# Patient Record
Sex: Female | Born: 1955 | Race: White | Hispanic: No | Marital: Married | Smoking: Former smoker
Health system: Southern US, Community
[De-identification: ages and names within clinical notes are randomized; demographics above are authoritative.]

## PROBLEM LIST (undated history)

## (undated) DIAGNOSIS — F419 Anxiety disorder, unspecified: Principal | ICD-10-CM

## (undated) DIAGNOSIS — I499 Cardiac arrhythmia, unspecified: Secondary | ICD-10-CM

## (undated) DIAGNOSIS — N952 Postmenopausal atrophic vaginitis: Secondary | ICD-10-CM

## (undated) DIAGNOSIS — K743 Primary biliary cirrhosis: Secondary | ICD-10-CM

## (undated) DIAGNOSIS — E785 Hyperlipidemia, unspecified: Secondary | ICD-10-CM

## (undated) DIAGNOSIS — M7550 Bursitis of unspecified shoulder: Secondary | ICD-10-CM

## (undated) DIAGNOSIS — Z01419 Encounter for gynecological examination (general) (routine) without abnormal findings: Secondary | ICD-10-CM

## (undated) DIAGNOSIS — K649 Unspecified hemorrhoids: Secondary | ICD-10-CM

## (undated) DIAGNOSIS — I8390 Asymptomatic varicose veins of unspecified lower extremity: Secondary | ICD-10-CM

## (undated) DIAGNOSIS — N951 Menopausal and female climacteric states: Secondary | ICD-10-CM

## (undated) DIAGNOSIS — N841 Polyp of cervix uteri: Secondary | ICD-10-CM

## (undated) DIAGNOSIS — R932 Abnormal findings on diagnostic imaging of liver and biliary tract: Secondary | ICD-10-CM

## (undated) DIAGNOSIS — Z9289 Personal history of other medical treatment: Secondary | ICD-10-CM

## (undated) HISTORY — DX: Postmenopausal atrophic vaginitis: N95.2

## (undated) HISTORY — DX: Encounter for gynecological examination (general) (routine) without abnormal findings: Z01.419

## (undated) HISTORY — DX: Unspecified hemorrhoids: K64.9

## (undated) HISTORY — DX: Hyperlipidemia, unspecified: E78.5

## (undated) HISTORY — PX: COLONOSCOPY: SHX174

## (undated) HISTORY — DX: Menopausal and female climacteric states: N95.1

## (undated) HISTORY — DX: Primary biliary cirrhosis: K74.3

## (undated) HISTORY — DX: Bursitis of unspecified shoulder: M75.50

## (undated) HISTORY — DX: Abnormal findings on diagnostic imaging of liver and biliary tract: R93.2

## (undated) HISTORY — DX: Polyp of cervix uteri: N84.1

## (undated) HISTORY — DX: Cardiac arrhythmia, unspecified: I49.9

## (undated) HISTORY — DX: Anxiety disorder, unspecified: F41.9

## (undated) HISTORY — DX: Asymptomatic varicose veins of unspecified lower extremity: I83.90

## (undated) HISTORY — DX: Personal history of other medical treatment: Z92.89

---

## 2012-07-07 DIAGNOSIS — K743 Primary biliary cirrhosis: Secondary | ICD-10-CM

## 2012-07-07 HISTORY — DX: Primary biliary cirrhosis: K74.3

## 2013-01-11 ENCOUNTER — Other Ambulatory Visit: Payer: Self-pay | Admitting: Medical

## 2013-01-11 ENCOUNTER — Ambulatory Visit (INDEPENDENT_AMBULATORY_CARE_PROVIDER_SITE_OTHER): Payer: BC Managed Care – PPO | Admitting: Medical

## 2013-01-11 ENCOUNTER — Encounter: Payer: Self-pay | Admitting: Medical

## 2013-01-11 ENCOUNTER — Other Ambulatory Visit (HOSPITAL_COMMUNITY)
Admission: RE | Admit: 2013-01-11 | Discharge: 2013-01-11 | Disposition: A | Discharge status: Home / Self Care | Payer: BC Managed Care – PPO | Source: Ambulatory Visit | Attending: Family Medicine | Admitting: Family Medicine

## 2013-01-11 VITALS — BP 98/70 | HR 62 | Temp 98.0°F | Resp 16 | Ht 68.5 in | Wt 187.0 lb

## 2013-01-11 DIAGNOSIS — N951 Menopausal and female climacteric states: Secondary | ICD-10-CM

## 2013-01-11 DIAGNOSIS — R454 Irritability and anger: Secondary | ICD-10-CM

## 2013-01-11 DIAGNOSIS — R234 Changes in skin texture: Secondary | ICD-10-CM

## 2013-01-11 DIAGNOSIS — Z1239 Encounter for other screening for malignant neoplasm of breast: Secondary | ICD-10-CM

## 2013-01-11 DIAGNOSIS — L988 Other specified disorders of the skin and subcutaneous tissue: Secondary | ICD-10-CM

## 2013-01-11 DIAGNOSIS — Z0000 Encounter for general adult medical examination without abnormal findings: Secondary | ICD-10-CM

## 2013-01-11 DIAGNOSIS — Z01419 Encounter for gynecological examination (general) (routine) without abnormal findings: Secondary | ICD-10-CM | POA: Insufficient documentation

## 2013-01-11 DIAGNOSIS — Z1211 Encounter for screening for malignant neoplasm of colon: Secondary | ICD-10-CM

## 2013-01-11 DIAGNOSIS — Z124 Encounter for screening for malignant neoplasm of cervix: Secondary | ICD-10-CM

## 2013-01-11 LAB — POCT URINALYSIS DIPSTICK
Bilirubin, UA: NEGATIVE
Blood, UA: NEGATIVE
Glucose, UA: NEGATIVE
Ketones, UA: NEGATIVE
Leukocytes, UA: NEGATIVE
Nitrite, UA: NEGATIVE
Protein, UA: NEGATIVE
Spec Grav, UA: <=1.005
Urobilinogen, UA: NEGATIVE
pH, UA: 7

## 2013-01-11 LAB — COMPREHENSIVE METABOLIC PANEL
ALT: 51 U/L — ABNORMAL HIGH (ref 0–35)
AST: 61 U/L — ABNORMAL HIGH (ref 0–37)
Albumin: 3.9 g/dL (ref 3.5–5.2)
Alkaline Phosphatase: 239 U/L — ABNORMAL HIGH (ref 39–117)
BUN: 13 mg/dL (ref 6–23)
CO2: 27 mEq/L (ref 19–32)
Calcium: 9.4 mg/dL (ref 8.4–10.5)
Chloride: 103 mEq/L (ref 96–112)
Creat: 0.72 mg/dL (ref 0.50–1.10)
Glucose, Bld: 90 mg/dL (ref 70–99)
Potassium: 4.4 mEq/L (ref 3.5–5.3)
Sodium: 137 mEq/L (ref 135–145)
Total Bilirubin: 0.6 mg/dL (ref 0.3–1.2)
Total Protein: 8.8 g/dL — ABNORMAL HIGH (ref 6.0–8.3)

## 2013-01-11 LAB — LIPID PANEL
Cholesterol: 186 mg/dL (ref 0–200)
HDL: 51 mg/dL (ref >39)
LDL Cholesterol: 114 mg/dL — ABNORMAL HIGH (ref 0–99)
Total CHOL/HDL Ratio: 3.6 Ratio
Triglycerides: 106 mg/dL (ref <150)
VLDL: 21 mg/dL (ref 0–40)

## 2013-01-11 NOTE — Progress Notes (Addendum)
Subjective:   HPI  Michelle Mcmahon is a 57 y.o. female who presents for a complete physical.  New patient today.   Moved from [place] to [place] in 2012.     Preventative care: Last ophthalmology visit:n/a Last dental visit:yes Last colonoscopy:never  Last mammogram:never Last gynecological exam:some years ago Last ZOX:WRUEA Last labs:n/a  Prior vaccinations: TD or Tdap:with in 7 years Influenza:n/a Pneumococcal:n/a Shingles/Zostavax:n/a  Advanced directive:n/a Health care power of attorney:n/a Living will:n/a  Concerns: Menopausal symptoms - hot flashes, emotional irritability at times, thinning skin, some weight gain x "forever", 6-7 years ago.   No prior medication for this.   Drinks a lot of water, has fan on her at night.  Caffeine, sugar can make this worse.  Reviewed their medical, surgical, family, social, medication, and allergy history and updated chart as appropriate.   Past Medical History  Diagnosis Date  . Menopausal state   . Shoulder bursitis     left, prior steroidal shots at [facility]    Past Surgical History  Procedure Laterality Date  . Colonoscopy      never    Family History  Problem Relation Age of Onset  . Other Mother     accidental death  . Cancer Father     died of colon cancer age 7yo  . Heart disease Father     palpitations  . Cancer Brother     bone  . Diabetes Maternal Grandmother   . Heart disease Maternal Grandmother   . Stroke Neg Hx   . Hypertension Neg Hx     History   Social History  . Marital Status: Married    Spouse Name: N/A    Number of Children: N/A  . Years of Education: N/A   Occupational History  . Not on file.   Social History Main Topics  . Smoking status: Former Smoker    Types: Cigarettes  . Smokeless tobacco: Not on file  . Alcohol Use: 1.8 oz/week    1 Glasses of wine, 1 Cans of beer, 1 Shots of liquor per week  . Drug Use: No  . Sexually Active: Not on file   Other Topics Concern  . 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   No current facility-administered medications on file prior to visit.    No Known Allergies   Review of Systems Constitutional: -fever, -chills, -sweats, -unexpected weight change, -decreased appetite, -fatigue Allergy: -sneezing, -itching, -congestion Dermatology: -changing moles, --rash, -lumps ENT: -runny nose, -ear pain, -sore throat, -hoarseness, -sinus pain, -teeth pain, - ringing in ears, -hearing loss, -nosebleeds Cardiology: -chest pain, -palpitations, -swelling, -difficulty breathing when lying flat, -waking up short of breath Respiratory: -cough, -shortness of breath, -difficulty breathing with exercise or exertion, -wheezing, -coughing up blood Gastroenterology: -abdominal pain, -nausea, -vomiting, -diarrhea, -constipation, -blood in stool, -changes in bowel movement, -difficulty swallowing or eating Hematology: -bleeding, -bruising  Musculoskeletal: +joint aches, -muscle aches, -joint swelling, -back pain, -neck pain, -cramping, -changes in gait Ophthalmology: denies vision changes, eye redness, itching, discharge Urology: -burning with urination, -difficulty urinating, -blood in urine, -urinary frequency, -urgency, -incontinence Neurology: -headache, -weakness, -tingling, -numbness, -memory loss, -falls, -dizziness Psychology: +depressed mood, +agitation, +sleep problems     Objective:   Physical Exam  Nurse notes and vitals reviewed  General appearance: alert, no distress, WD/WN, pleasant  white female Skin: bilat forearms with some mild small 2cm area of ecchymosis, with adjacent abrasions, superficial, skin seems somewhat thin, other scatter benign macules and papular lesions chest, back, no worrisome lesions HEENT: normocephalic, conjunctiva/corneas normal, sclerae  anicteric, PERRLA, EOMi, nares patent, no discharge or erythema, pharynx normal Oral cavity: MMM, tongue normal, teeth in good repair Neck: supple, no lymphadenopathy, no thyromegaly, no masses, normal ROM, no bruits Chest: non tender, normal shape and expansion Heart: RRR, normal S1, S2, no murmurs Lungs: CTA bilaterally, no wheezes, rhonchi, or rales Abdomen: +bs, soft, non tender, non distended, no masses, no hepatomegaly, no splenomegaly, no bruits Back: non tender, normal ROM, no scoliosis Musculoskeletal: upper extremities non tender, no obvious deformity, normal ROM throughout, lower extremities non tender, no obvious deformity, normal ROM throughout Extremities: no edema, no cyanosis, no clubbing Pulses: 2+ symmetric, upper and lower extremities, normal cap refill Neurological: alert, oriented x 3, CN2-12 intact, strength normal upper extremities and lower extremities, sensation normal throughout, DTRs 2+ throughout, no cerebellar signs, gait normal Psychiatric: normal affect, behavior normal, pleasant  Breast: nontender, no masses or lumps, no skin changes, no nipple discharge or inversion, no axillary lymphadenopathy Gyn: Normal external genitalia without lesions, vagina with normal mucosa, cervix normal appearing , no cervical motion tenderness, no abnormal vaginal discharge.  Uterus and adnexa not enlarged, nontender, no masses.  Pap performed.  Exam chaperoned by nurse. Rectal: deferred    Assessment and Plan :    Encounter Diagnoses  Name Primary?  . Routine general medical examination at a health care facility Yes  . Screening for breast cancer   . Special screening for malignant neoplasms, colon   . Screening for cervical cancer   . Menopausal state   . Irritability   . Thin skin       Physical exam - discussed healthy lifestyle, diet, exercise, preventative care, vaccinations, and addressed their concerns.  Handout given.  Referrals for her first colonoscopy and  mammogram, pap updated today, routine labs today.  additional labs given her symptoms.    menopause - handout given, discussed supportive measures, possible medications pending labs.  Discussed risks/benefits of HRT and she would rather avoid this for now.    Follow-up pending labs, referrals

## 2013-01-11 NOTE — Progress Notes (Signed)
PATIENT IS AWARE OF HER MAMMOGRAM APPOINTMENT ON January 27, 2013 @ 245 PM [facility]. CLS

## 2013-01-11 NOTE — Patient Instructions (Signed)
Menopause Menopause is the normal time of life when menstrual periods stop completely. Menopause is complete when you have missed 12 consecutive menstrual periods. It usually occurs between the ages of 48 to 55, with an average age of 51. Very rarely does a woman develop menopause before 56 years old. At menopause, your ovaries stop producing the female hormones, estrogen and progesterone. This can cause undesirable symptoms and also affect your health. Sometimes the symptoms may occur 4 to 5 years before the menopause begins. There is no relationship between menopause and:  Oral contraceptives.  Number of children you had.  Race.  The age your menstrual periods started (menarche). Heavy smokers and very thin women may develop menopause earlier in life. CAUSES  The ovaries stop producing the female hormones estrogen and progesterone.  Other causes include:  Surgery to remove both ovaries.  The ovaries stop functioning for no known reason.  Tumors of the pituitary gland in the brain.  Medical disease that affects the ovaries and hormone production.  Radiation treatment to the abdomen or pelvis.  Chemotherapy that affects the ovaries. SYMPTOMS   Hot flashes.  Night sweats.  Decrease in sex drive.  Vaginal dryness and thinning of the vagina causing painful intercourse.  Dryness of the skin and developing wrinkles.  Headaches.  Tiredness.  Irritability.  Memory problems.  Weight gain.  Bladder infections.  Hair growth of the face and chest.  Infertility. More serious symptoms include:  Loss of bone (osteoporosis) causing breaks (fractures).  Depression.  Hardening and narrowing of the arteries (atherosclerosis) causing heart attacks and strokes. DIAGNOSIS   When the menstrual periods have stopped for 12 straight months.  Physical exam.  Hormone studies of the blood. TREATMENT  There are many treatment choices and nearly as many questions about them.  The decisions to treat or not to treat menopausal changes is an individual choice made with your caregiver. Your caregiver can discuss the treatments with you. Together, you can decide which treatment will work best for you. Your treatment choices may include:   Hormone therapy (estorgen and progesterone).  Non-hormonal medications.  Treating the individual symptoms with medication (for example antidepressants for depression).  Herbal medications that may help specific symptoms.  Counseling by a psychiatrist or psychologist.  Group therapy.  Lifestyle changes including:  Eating healthy.  Regular exercise.  Limiting caffeine and alcohol.  Stress management and meditation.  No treatment. HOME CARE INSTRUCTIONS   Take the medication your caregiver gives you as directed.  Get plenty of sleep and rest.  Exercise regularly.  Eat a diet that contains calcium (good for the bones) and soy products (acts like estrogen hormone).  Avoid alcoholic beverages.  Do not smoke.  If you have hot flashes, dress in layers.  Take supplements, calcium and vitamin D to strengthen bones.  You can use over-the-counter lubricants or moisturizers for vaginal dryness.  Group therapy is sometimes very helpful.  Acupuncture may be helpful in some cases. SEEK MEDICAL CARE IF:   You are not sure you are in menopause.  You are having menopausal symptoms and need advice and treatment.  You are still having menstrual periods after age 55.  You have pain with intercourse.  Menopause is complete (no menstrual period for 12 months) and you develop vaginal bleeding.  You need a referral to a specialist (gynecologist, psychiatrist or psychologist) for treatment. SEEK IMMEDIATE MEDICAL CARE IF:   You have severe depression.  You have excessive vaginal bleeding.  You fell and   think you have a broken bone.  You have pain when you urinate.  You develop leg or chest pain.  You have a fast  pounding heart beat (palpitations).  You have severe headaches.  You develop vision problems.  You feel a lump in your breast.  You have abdominal pain or severe indigestion. Document Released: 09/13/2003 Document Revised: 09/15/2011 Document Reviewed: 04/20/2008 [facility] Patient Information 2014 [place], [place].    Preventative Care for Adults - Female      MAINTAIN REGULAR HEALTH EXAMS:  A routine yearly physical is a good way to check in with your primary care provider about your health and preventive screening. It is also an opportunity to share updates about your health and any concerns you have, and receive a thorough all-over exam.   Most health insurance companies pay for at least some preventative services.  Check with your health plan for specific coverages.  WHAT PREVENTATIVE SERVICES DO WOMEN NEED?  Adult women should have their weight and blood pressure checked regularly.   Women age 49 and older should have their cholesterol levels checked regularly.  Women should be screened for cervical cancer with a Pap smear and pelvic exam beginning at either age 48, or 3 years after they become sexually activity.    Breast cancer screening generally begins at age 70 with a mammogram and breast exam by your primary care provider.    Beginning at age 20 and continuing to age 26, women should be screened for colorectal cancer.  Certain people may need continued testing until age 63.  Updating vaccinations is part of preventative care.  Vaccinations help protect against diseases such as the flu.  Osteoporosis is a disease in which the bones lose minerals and strength as we age. Women ages 69 and over should discuss this with their caregivers, as should women after menopause who have other risk factors.  Lab tests are generally done as part of preventative care to screen for anemia and blood disorders, to screen for problems with the kidneys and liver, to screen for bladder  problems, to check blood sugar, and to check your cholesterol level.  Preventative services generally include counseling about diet, exercise, avoiding tobacco, drugs, excessive alcohol consumption, and sexually transmitted infections.    GENERAL RECOMMENDATIONS FOR GOOD HEALTH:  Healthy diet:  Eat a variety of foods, including fruit, vegetables, animal or vegetable protein, such as meat, fish, chicken, and eggs, or beans, lentils, tofu, and grains, such as rice.  Drink plenty of water daily.  Decrease saturated fat in the diet, avoid lots of red meat, processed foods, sweets, fast foods, and fried foods.  Exercise:  Aerobic exercise helps maintain good heart health. At least 30-40 minutes of moderate-intensity exercise is recommended. For example, a brisk walk that increases your heart rate and breathing. This should be done on most days of the week.   Find a type of exercise or a variety of exercises that you enjoy so that it becomes a part of your daily life.  Examples are running, walking, swimming, water aerobics, and biking.  For motivation and support, explore group exercise such as aerobic class, spin class, Zumba, Yoga,or  martial arts, etc.    Set exercise goals for yourself, such as a certain weight goal, walk or run in a race such as a 5k walk/run.  Speak to your primary care provider about exercise goals.  Disease prevention:  If you smoke or chew tobacco, find out from your caregiver how to quit.  It can literally save your life, no matter how long you have been a tobacco user. If you do not use tobacco, never begin.   Maintain a healthy diet and normal weight. Increased weight leads to problems with blood pressure and diabetes.   The Body Mass Index or BMI is a way of measuring how much of your body is fat. Having a BMI above 27 increases the risk of heart disease, diabetes, hypertension, stroke and other problems related to obesity. Your caregiver can help determine your  BMI and based on it develop an exercise and dietary program to help you achieve or maintain this important measurement at a healthful level.  High blood pressure causes heart and blood vessel problems.  Persistent high blood pressure should be treated with medicine if weight loss and exercise do not work.   Fat and cholesterol leaves deposits in your arteries that can block them. This causes heart disease and vessel disease elsewhere in your body.  If your cholesterol is found to be high, or if you have heart disease or certain other medical conditions, then you may need to have your cholesterol monitored frequently and be treated with medication.   Ask if you should have a cardiac stress test if your history suggests this. A stress test is a test done on a treadmill that looks for heart disease. This test can find disease prior to there being a problem.  Menopause can be associated with physical symptoms and risks. Hormone replacement therapy is available to decrease these. You should talk to your caregiver about whether starting or continuing to take hormones is right for you.   Osteoporosis is a disease in which the bones lose minerals and strength as we age. This can result in serious bone fractures. Risk of osteoporosis can be identified using a bone density scan. Women ages 36 and over should discuss this with their caregivers, as should women after menopause who have other risk factors. Ask your caregiver whether you should be taking a calcium supplement and Vitamin D, to reduce the rate of osteoporosis.   Avoid drinking alcohol in excess (more than two drinks per day).  Avoid use of street drugs. Do not share needles with anyone. Ask for professional help if you need assistance or instructions on stopping the use of alcohol, cigarettes, and/or drugs.  Brush your teeth twice a day with fluoride toothpaste, and floss once a day. Good oral hygiene prevents tooth decay and gum disease. The problems  can be painful, unattractive, and can cause other health problems. Visit your dentist for a routine oral and dental check up and preventive care every 6-12 months.   Look at your skin regularly.  Use a mirror to look at your back. Notify your caregivers of changes in moles, especially if there are changes in shapes, colors, a size larger than a pencil eraser, an irregular border, or development of new moles.  Safety:  Use seatbelts 100% of the time, whether driving or as a passenger.  Use safety devices such as hearing protection if you work in environments with loud noise or significant background noise.  Use safety glasses when doing any work that could send debris in to the eyes.  Use a helmet if you ride a bike or motorcycle.  Use appropriate safety gear for contact sports.  Talk to your caregiver about gun safety.  Use sunscreen with a SPF (or skin protection factor) of 15 or greater.  Lighter skinned people are at a greater  risk of skin cancer. Don't forget to also wear sunglasses in order to protect your eyes from too much damaging sunlight. Damaging sunlight can accelerate cataract formation.   Practice safe sex. Use condoms. Condoms are used for birth control and to help reduce the spread of sexually transmitted infections (or STIs).  Some of the STIs are gonorrhea (the clap), chlamydia, syphilis, trichomonas, herpes, HPV (human papilloma virus) and HIV (human immunodeficiency virus) which causes AIDS. The herpes, HIV and HPV are viral illnesses that have no cure. These can result in disability, cancer and death.   Keep carbon monoxide and smoke detectors in your home functioning at all times. Change the batteries every 6 months or use a model that plugs into the wall.   Vaccinations:  Stay up to date with your tetanus shots and other required immunizations. You should have a booster for tetanus every 10 years. Be sure to get your flu shot every year, since 5%-20% of the [place] population  comes down with the flu. The flu vaccine changes each year, so being vaccinated once is not enough. Get your shot in the fall, before the flu season peaks.   Other vaccines to consider:  Human Papilloma Virus or HPV causes cancer of the cervix, and other infections that can be transmitted from person to person. There is a vaccine for HPV, and females should get immunized between the ages of 70 and 61. It requires a series of 3 shots.   Pneumococcal vaccine to protect against certain types of pneumonia.  This is normally recommended for adults age 66 or older.  However, adults younger than 56 years old with certain underlying conditions such as diabetes, heart or lung disease should also receive the vaccine.  Shingles vaccine to protect against Varicella Zoster if you are older than age 75, or younger than 56 years old with certain underlying illness.  Hepatitis A vaccine to protect against a form of infection of the liver by a virus acquired from food.  Hepatitis B vaccine to protect against a form of infection of the liver by a virus acquired from blood or body fluids, particularly if you work in health care.  If you plan to travel internationally, check with your local health department for specific vaccination recommendations.  Cancer Screening:  Breast cancer screening is essential to preventive care for women. All women age 63 and older should perform a breast self-exam every month. At age 81 and older, women should have their caregiver complete a breast exam each year. Women at ages 91 and older should have a mammogram (x-ray film) of the breasts. Your caregiver can discuss how often you need mammograms.    Cervical cancer screening includes taking a Pap smear (sample of cells examined under a microscope) from the cervix (end of the uterus). It also includes testing for HPV (Human Papilloma Virus, which can cause cervical cancer). Screening and a pelvic exam should begin at age 33, or 3  years after a woman becomes sexually active. Screening should occur every year, with a Pap smear but no HPV testing, up to age 45. After age 50, you should have a Pap smear every 3 years with HPV testing, if no HPV was found previously.   Most routine colon cancer screening begins at the age of 74. On a yearly basis, doctors may provide special easy to use take-home tests to check for hidden blood in the stool. Sigmoidoscopy or colonoscopy can detect the earliest forms of colon cancer and is  life saving. These tests use a small camera at the end of a tube to directly examine the colon. Speak to your caregiver about this at age 46, when routine screening begins (and is repeated every 5 years unless early forms of pre-cancerous polyps or small growths are found).

## 2013-01-12 LAB — CBC WITH DIFFERENTIAL/PLATELET
Basophils Absolute: 0 10*3/uL (ref 0.0–0.1)
Basophils Relative: 0 % (ref 0–1)
Eosinophils Absolute: 0.2 10*3/uL (ref 0.0–0.7)
Eosinophils Relative: 3 % (ref 0–5)
HCT: 39.6 % (ref 36.0–46.0)
Hemoglobin: 13.2 g/dL (ref 12.0–15.0)
Lymphocytes Relative: 58 % — ABNORMAL HIGH (ref 12–46)
Lymphs Abs: 3.2 10*3/uL (ref 0.7–4.0)
MCH: 31.1 pg (ref 26.0–34.0)
MCHC: 33.3 g/dL (ref 30.0–36.0)
MCV: 93.4 fL (ref 78.0–100.0)
Monocytes Absolute: 0.5 10*3/uL (ref 0.1–1.0)
Monocytes Relative: 9 % (ref 3–12)
Neutro Abs: 1.7 10*3/uL (ref 1.7–7.7)
Neutrophils Relative %: 30 % — ABNORMAL LOW (ref 43–77)
Platelets: 194 10*3/uL (ref 150–400)
RBC: 4.24 MIL/uL (ref 3.87–5.11)
RDW: 13.4 % (ref 11.5–15.5)
WBC: 5.6 10*3/uL (ref 4.0–10.5)

## 2013-01-12 LAB — HM PAP SMEAR: HM Pap smear: NEGATIVE

## 2013-01-12 LAB — VITAMIN D 25 HYDROXY (VIT D DEFICIENCY, FRACTURES): Vit D, 25-Hydroxy: 37 ng/mL (ref 30–89)

## 2013-01-12 LAB — GAMMA GT: GGT: 251 U/L — ABNORMAL HIGH (ref 7–51)

## 2013-01-12 LAB — TSH: TSH: 1.527 u[IU]/mL (ref 0.350–4.500)

## 2013-01-13 ENCOUNTER — Other Ambulatory Visit: Payer: Self-pay | Admitting: Medical

## 2013-01-13 ENCOUNTER — Encounter: Payer: Self-pay | Admitting: Internal Medicine

## 2013-01-13 DIAGNOSIS — R748 Abnormal levels of other serum enzymes: Secondary | ICD-10-CM

## 2013-01-13 MED ORDER — CITALOPRAM HYDROBROMIDE 20 MG PO TABS: ORAL_TABLET | ORAL | Status: DC

## 2013-01-27 ENCOUNTER — Ambulatory Visit: Payer: Self-pay

## 2013-02-10 ENCOUNTER — Ambulatory Visit: Payer: Self-pay

## 2013-02-24 ENCOUNTER — Encounter: Payer: Self-pay | Admitting: Gastroenterology

## 2013-03-04 ENCOUNTER — Ambulatory Visit: Payer: Self-pay

## 2013-03-24 ENCOUNTER — Ambulatory Visit
Admission: RE | Admit: 2013-03-24 | Discharge: 2013-03-24 | Disposition: A | Discharge status: Home / Self Care | Payer: BC Managed Care – PPO | Source: Ambulatory Visit | Attending: Medical | Admitting: Medical

## 2013-03-24 DIAGNOSIS — Z1239 Encounter for other screening for malignant neoplasm of breast: Secondary | ICD-10-CM

## 2013-03-28 ENCOUNTER — Telehealth: Payer: Self-pay | Admitting: Family Medicine

## 2013-03-28 NOTE — Telephone Encounter (Signed)
Have her return to discuss hot flashes/menopausal symptoms, abnormal liver tests, and medication options

## 2013-03-28 NOTE — Telephone Encounter (Signed)
Patient called and said that the medication you gave her to take for her hot flashes and Menopause ( Celexa) . She states that it is causing her to feel nausea and fatigue. She would like to know what she should do? CLS

## 2013-03-29 NOTE — Telephone Encounter (Signed)
I left a message for the patient to call back and schedule an OV. CLS

## 2013-04-06 DIAGNOSIS — Z01419 Encounter for gynecological examination (general) (routine) without abnormal findings: Secondary | ICD-10-CM

## 2013-04-06 DIAGNOSIS — R932 Abnormal findings on diagnostic imaging of liver and biliary tract: Secondary | ICD-10-CM

## 2013-04-06 DIAGNOSIS — N841 Polyp of cervix uteri: Secondary | ICD-10-CM

## 2013-04-06 HISTORY — DX: Abnormal findings on diagnostic imaging of liver and biliary tract: R93.2

## 2013-04-06 HISTORY — DX: Encounter for gynecological examination (general) (routine) without abnormal findings: Z01.419

## 2013-04-06 HISTORY — PX: CERVICAL BIOPSY: SHX590

## 2013-04-06 HISTORY — DX: Polyp of cervix uteri: N84.1

## 2013-04-11 ENCOUNTER — Encounter: Payer: Self-pay | Admitting: Medical

## 2013-04-11 ENCOUNTER — Ambulatory Visit (INDEPENDENT_AMBULATORY_CARE_PROVIDER_SITE_OTHER): Payer: BC Managed Care – PPO | Admitting: Medical

## 2013-04-11 ENCOUNTER — Telehealth: Payer: Self-pay | Admitting: Family Medicine

## 2013-04-11 VITALS — BP 100/80 | HR 60 | Temp 98.5°F | Resp 16 | Wt 186.0 lb

## 2013-04-11 DIAGNOSIS — R5381 Other malaise: Secondary | ICD-10-CM

## 2013-04-11 DIAGNOSIS — R799 Abnormal finding of blood chemistry, unspecified: Secondary | ICD-10-CM

## 2013-04-11 DIAGNOSIS — N840 Polyp of corpus uteri: Secondary | ICD-10-CM

## 2013-04-11 DIAGNOSIS — Z780 Asymptomatic menopausal state: Secondary | ICD-10-CM

## 2013-04-11 DIAGNOSIS — N951 Menopausal and female climacteric states: Secondary | ICD-10-CM

## 2013-04-11 DIAGNOSIS — R7989 Other specified abnormal findings of blood chemistry: Secondary | ICD-10-CM

## 2013-04-11 DIAGNOSIS — R779 Abnormality of plasma protein, unspecified: Secondary | ICD-10-CM

## 2013-04-11 LAB — HEPATITIS B SURFACE ANTIGEN: Hepatitis B Surface Ag: NEGATIVE

## 2013-04-11 LAB — HEPATIC FUNCTION PANEL
ALT: 52 U/L — ABNORMAL HIGH (ref 0–35)
AST: 62 U/L — ABNORMAL HIGH (ref 0–37)
Albumin: 4 g/dL (ref 3.5–5.2)
Alkaline Phosphatase: 242 U/L — ABNORMAL HIGH (ref 39–117)
Bilirubin, Direct: 0.2 mg/dL (ref 0.0–0.3)
Indirect Bilirubin: 0.3 mg/dL (ref 0.0–0.9)
Total Bilirubin: 0.5 mg/dL (ref 0.3–1.2)
Total Protein: 8.7 g/dL — ABNORMAL HIGH (ref 6.0–8.3)

## 2013-04-11 LAB — HEPATITIS C ANTIBODY: HCV Ab: REACTIVE — AB

## 2013-04-11 LAB — HIV ANTIBODY (ROUTINE TESTING W REFLEX): HIV: NONREACTIVE

## 2013-04-11 MED ORDER — CITALOPRAM HYDROBROMIDE 40 MG PO TABS: 40.0000 mg | ORAL_TABLET | Freq: Every day | ORAL | Status: DC

## 2013-04-11 NOTE — Telephone Encounter (Signed)
Patient is aware of her appointment to see Dr. Ernestina Penna on 04/27/13 @ 900 am. CLS [phone]

## 2013-04-11 NOTE — Progress Notes (Signed)
Subjective: Here for general follow up.  I saw her as a new patient in July for a physical. She has since gotten her mammogram, colonoscopy is scheduled for a month now.  She is here to followup on abnormal labs as well as menopausal symptoms.   Her liver test came back abnormal. She denies history of similar, denies heavy alcohol intake or over-the-counter analgesic intake.  Drinks maybe one alcohol beverage her week.  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 No history of blood transfusion  She is still having menopausal symptoms including hot flashes, irritability, sleep issues, and anxiety at times. Her husband felt like she did have improvement on citalopram, although she didn't feel like there was any major improvement with hot flashes. The medication did help her anxiety and irritability.  No new problems.  Past Medical History  Diagnosis Date  . Menopausal state   . Shoulder bursitis     left, prior steroidal shots at [facility]   Family History  Problem Relation Age of Onset  . Other Mother     accidental death  . Cancer Father     died of colon cancer age 73yo  . Heart disease Father     palpitations  . Cancer Brother     bone  . Diabetes Maternal Grandmother   . Heart disease Maternal Grandmother   . Stroke Neg Hx   . Hypertension Neg Hx    ROS as in subjective  Objective: Filed Vitals:   04/11/13 1117  BP: 100/80  Pulse: 60  Temp: 98.5 F (36.9 C)  Resp: 16    General appearance: alert, no distress, WD/WN  Skin: No jaundice, no obvious ecchymosis other than small ecchymosis 1 cm diameter or less of left forearm Neck: supple, no lymphadenopathy, no thyromegaly, no masses Heart: RRR, normal S1, S2, no murmurs Lungs: CTA bilaterally, no wheezes, rhonchi, or rales Abdomen: +bs, soft, non tender, non distended, no masses, no hepatomegaly, no  splenomegaly Pulses: 2+ symmetric, upper and lower extremities, normal cap refill  Assessment: Encounter Diagnoses  Name Primary?  . Elevated LFTs Yes  . Elevated serum protein level   . Other malaise and fatigue   . Menopause   . Uterine polyp    Plan: We discussed possible causes of elevated LFTs and serum protein level.  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   additional labs today.     Menopause-increased to citalopram 40 mg a day. If not much improved in 2-3 weeks, consider changing to Effexor or adding HRT.  We did discuss risk and benefits of hormone therapy, and she would be interested in this as an option.  Uterine polyp-I noted this on exam back in July. Pap smear was normal, but there was a fleshy growth coming out of the cervical os on exam at that time, most likely a polyp  Follow-up pending labs

## 2013-04-11 NOTE — Telephone Encounter (Signed)
Message copied by Janeice Robinson on Mon Apr 11, 2013  1:21 PM ------      Message from: Jac Canavan      Created: Mon Apr 11, 2013  1:18 PM       Refer to Dr. Ernestina Penna, uterine polyp noted on exam. ------

## 2013-04-12 ENCOUNTER — Other Ambulatory Visit: Payer: Self-pay | Admitting: Medical

## 2013-04-12 ENCOUNTER — Encounter: Payer: Self-pay | Admitting: Medical

## 2013-04-12 ENCOUNTER — Ambulatory Visit (INDEPENDENT_AMBULATORY_CARE_PROVIDER_SITE_OTHER): Payer: BC Managed Care – PPO | Admitting: Medical

## 2013-04-12 VITALS — BP 130/80 | HR 72 | Resp 16

## 2013-04-12 DIAGNOSIS — R799 Abnormal finding of blood chemistry, unspecified: Secondary | ICD-10-CM

## 2013-04-12 DIAGNOSIS — B1920 Unspecified viral hepatitis C without hepatic coma: Secondary | ICD-10-CM

## 2013-04-12 DIAGNOSIS — R779 Abnormality of plasma protein, unspecified: Secondary | ICD-10-CM

## 2013-04-12 DIAGNOSIS — R7989 Other specified abnormal findings of blood chemistry: Secondary | ICD-10-CM

## 2013-04-12 LAB — APTT: aPTT: 34 seconds (ref 24–37)

## 2013-04-12 LAB — PROTIME-INR
INR: 1.01 (ref <1.50)
Prothrombin Time: 13.3 seconds (ref 11.6–15.2)

## 2013-04-12 LAB — HEPATITIS B CORE ANTIBODY, IGM: Hep B C IgM: NEGATIVE

## 2013-04-12 NOTE — Progress Notes (Signed)
Subjective: Here today with her husband to discuss her recent abnormal lab results.  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  She is healthy, exercises, and has no other worries for why her liver tests to be elevated.  Prior to her recent visit with me, she had not had routine physicals or labs in years.  She does have some family history of cancer.  She is due to have her colonoscopy soon.  Past Medical History  Diagnosis Date  . Menopausal state   . Shoulder bursitis     left, prior steroidal shots at Urgent Care    Past Surgical History  Procedure Laterality Date  . Colonoscopy      never    History   Social History  . Marital Status: Married    Spouse Name: N/A    Number of Children: N/A  . Years of Education: N/A   Occupational History  . Not on file.   Social History Main Topics  . Smoking status: Former Smoker    Types: Cigarettes  . Smokeless tobacco: Not on file  . Alcohol Use: 1.8 oz/week    1 Glasses of wine, 1 Cans of beer, 1 Shots of liquor per week  . Drug Use: No  . Sexual Activity: Not on file   Other Topics Concern  . 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 Other Mother     accidental death  . Cancer Father     died of colon cancer age 90yo  . Heart disease Father     palpitations  . Cancer Brother     bone  . Diabetes Maternal Grandmother   . Heart disease Maternal Grandmother   . Stroke Neg Hx   . Hypertension Neg Hx     Current outpatient prescriptions:citalopram (CELEXA) 40 MG tablet, Take 1 tablet (40 mg total) by mouth daily., Disp: 30 tablet, Rfl: 3  No Known  Allergies   Objective: General: Well-developed, well-nourished, no acute distress Psych: Pleasant, answers questions appropriately, seems little anxious Abdomen: Nontender, no mass, no organomegaly HEENT: Unremarkable: No jaundice, no signs of liver disease  Assessment: Encounter Diagnoses  Name Primary?  . Hepatitis C infection Yes  . Elevated LFTs   . Elevated serum protein level    Plan:  We discussed the diagnosis of hepatitis C. We screened her and ruled out hepatitis B or HIV infection.  We will check a viral load and genotype, we discussed vaccinations, we will check her hep B immunity, discussed abdominal imaging that we will pursue, additional labs today, and referral to hepatitis clinic. Discussed screening for husband as well as children for hepatitis as we are not sure when she contracted this.  Discussed general precautions, potential complications of hepatitis C.  I answered her and her husband's questions.  Followup pending labs and studies.  Discussed case with Dr. Susann Givens supervising physician after patient left, and we will add SPEP and RUQ ultrasound as well.  Her husband is a patient of Dr. Susann Givens, and he went ahead and got Hep C Ab test while he was here.

## 2013-04-13 ENCOUNTER — Telehealth: Payer: Self-pay | Admitting: Family Medicine

## 2013-04-13 LAB — AFP TUMOR MARKER: AFP-Tumor Marker: 3.6 ng/mL (ref 0.0–8.0)

## 2013-04-13 LAB — HEPATITIS B SURFACE ANTIBODY,QUALITATIVE

## 2013-04-13 NOTE — Telephone Encounter (Signed)
Patient is aware of her appointment to have her abdomen U/S on 04/18/13 @ 800 am. CLS

## 2013-04-13 NOTE — Telephone Encounter (Signed)
Message copied by Janeice Robinson on Wed Apr 13, 2013 11:26 AM ------      Message from: Jac Canavan      Created: Tue Apr 12, 2013  8:56 PM       Set up abdominal imaging, see orders, refer to hepatitis clinic for hepatitis C infection. ------

## 2013-04-14 LAB — HEPATITIS C RNA QUANTITATIVE: HCV Quantitative: NOT DETECTED IU/mL (ref <15)

## 2013-04-15 LAB — PROTEIN ELECTROPHORESIS, SERUM
Albumin ELP: 47 % — ABNORMAL LOW (ref 55.8–66.1)
Alpha-1-Globulin: 3.4 % (ref 2.9–4.9)
Alpha-2-Globulin: 8.9 % (ref 7.1–11.8)
Beta 2: 4 % (ref 3.2–6.5)
Beta Globulin: 5.5 % (ref 4.7–7.2)
Gamma Globulin: 31.2 % — ABNORMAL HIGH (ref 11.1–18.8)
Total Protein, Serum Electrophoresis: 9.4 g/dL — ABNORMAL HIGH (ref 6.0–8.3)

## 2013-04-15 LAB — HEPATITIS C GENOTYPE

## 2013-04-18 ENCOUNTER — Ambulatory Visit
Admission: RE | Admit: 2013-04-18 | Discharge: 2013-04-18 | Disposition: A | Discharge status: Home / Self Care | Payer: BC Managed Care – PPO | Source: Ambulatory Visit | Attending: Medical | Admitting: Medical

## 2013-04-18 DIAGNOSIS — B1920 Unspecified viral hepatitis C without hepatic coma: Secondary | ICD-10-CM

## 2013-04-18 DIAGNOSIS — R7989 Other specified abnormal findings of blood chemistry: Secondary | ICD-10-CM

## 2013-04-20 ENCOUNTER — Other Ambulatory Visit: Payer: Self-pay | Admitting: Medical

## 2013-04-22 ENCOUNTER — Telehealth: Payer: Self-pay | Admitting: Medical

## 2013-04-22 ENCOUNTER — Other Ambulatory Visit: Payer: Self-pay | Admitting: Medical

## 2013-04-22 DIAGNOSIS — R7989 Other specified abnormal findings of blood chemistry: Secondary | ICD-10-CM

## 2013-04-22 NOTE — Telephone Encounter (Signed)
I called pt and advised of results including negative viral load, as well as Dr. Haywood Pao recommendations for additional labs (see prior msg).  She will return for labs as well as Hep B #1 vaccine.

## 2013-04-25 ENCOUNTER — Encounter: Payer: Self-pay | Admitting: Gastroenterology

## 2013-04-25 ENCOUNTER — Other Ambulatory Visit (INDEPENDENT_AMBULATORY_CARE_PROVIDER_SITE_OTHER): Payer: BC Managed Care – PPO

## 2013-04-25 ENCOUNTER — Other Ambulatory Visit: Payer: Self-pay | Admitting: Medical

## 2013-04-25 ENCOUNTER — Ambulatory Visit (AMBULATORY_SURGERY_CENTER): Payer: Self-pay | Admitting: *Deleted

## 2013-04-25 ENCOUNTER — Telehealth: Payer: Self-pay | Admitting: *Deleted

## 2013-04-25 VITALS — Ht 69.0 in | Wt 188.4 lb

## 2013-04-25 DIAGNOSIS — D649 Anemia, unspecified: Secondary | ICD-10-CM

## 2013-04-25 DIAGNOSIS — Z23 Encounter for immunization: Secondary | ICD-10-CM

## 2013-04-25 DIAGNOSIS — R7989 Other specified abnormal findings of blood chemistry: Secondary | ICD-10-CM

## 2013-04-25 DIAGNOSIS — Z1211 Encounter for screening for malignant neoplasm of colon: Secondary | ICD-10-CM

## 2013-04-25 DIAGNOSIS — R748 Abnormal levels of other serum enzymes: Secondary | ICD-10-CM

## 2013-04-25 LAB — FERRITIN: Ferritin: 111 ng/mL (ref 10–291)

## 2013-04-25 LAB — IRON AND TIBC
%SAT: 30 % (ref 20–55)
Iron: 78 ug/dL (ref 42–145)
TIBC: 263 ug/dL (ref 250–470)
UIBC: 185 ug/dL (ref 125–400)

## 2013-04-25 LAB — HEPATIC FUNCTION PANEL
ALT: 38 U/L — ABNORMAL HIGH (ref 0–35)
AST: 50 U/L — ABNORMAL HIGH (ref 0–37)
Albumin: 3.8 g/dL (ref 3.5–5.2)
Alkaline Phosphatase: 210 U/L — ABNORMAL HIGH (ref 39–117)
Bilirubin, Direct: 0.1 mg/dL (ref 0.0–0.3)
Indirect Bilirubin: 0.2 mg/dL (ref 0.0–0.9)
Total Bilirubin: 0.3 mg/dL (ref 0.3–1.2)
Total Protein: 8.2 g/dL (ref 6.0–8.3)

## 2013-04-25 MED ORDER — MOVIPREP 100 G PO SOLR: ORAL | Status: DC

## 2013-04-25 NOTE — Telephone Encounter (Signed)
Dr Christella Hartigan: pt scheduled for direct screening colonoscopy 11/3.  Pt was seen by Onalee Hua Tysinger/Dr LaLonde's office 10/6 for physical.  Had elevated LFT.  Abdominal CT  10/13.  All results and visit documented in EPIC.  Is this pt okay for direct colonoscopy or do you want to see her in the office first?  Thanks, Olegario Messier in [facility].

## 2013-04-26 LAB — ANTI-NUCLEAR AB-TITER (ANA TITER): ANA Titer 1: NEGATIVE

## 2013-04-26 LAB — ANTI-SMOOTH MUSCLE ANTIBODY, IGG: Smooth Muscle Ab: 15 U (ref <20)

## 2013-04-26 LAB — ANA: Anti Nuclear Antibody(ANA): POSITIVE — AB

## 2013-04-27 LAB — ALPHA-1-ANTITRYPSIN: A-1 Antitrypsin, Ser: 160 mg/dL (ref 90–200)

## 2013-04-27 LAB — IGG: IgG (Immunoglobin G), Serum: 2700 mg/dL — ABNORMAL HIGH (ref 690–1700)

## 2013-05-01 NOTE — Telephone Encounter (Signed)
Please call her.  I reviewed her recent labs, images.  Looks like she is scheduled to see Dr. Elnoria Howard for new, recently diagnosed autoimmune hepatitis.  Should cancel her upcoming screening colonoscopy for now.  She should discuss colon cancer screening with Dr. Elnoria Howard (he can do that for her as well).  Alternatively, I am happy to see her for both AIH and colon cancer screening and she can cancel upcoming appt with Dr. Elnoria Howard.  Either way works, but I don't think she needs two gastroenterologists (one for her liver, one for her colon).  Thanks

## 2013-05-02 ENCOUNTER — Telehealth: Payer: Self-pay | Admitting: Medical

## 2013-05-02 NOTE — Telephone Encounter (Signed)
Spoke with patient. Explained the response from Dr.Jacobs. She wants to cancel her direct colonoscopy with Dr.Jacobs at this time. She will see Dr.Hung. Explained that Dr.Jacobs will be happy to see her, so if she needs him just call office. She understands. Appointment cancelled. RM

## 2013-05-02 NOTE — Telephone Encounter (Signed)
I called Dr. Elnoria Howard office to find out if pt had an appt yet with them. They told me Monday November 3rd @ 3pm. i told the pt where his office was located and their phone number so pt could call and confirm with them. But pt is aware

## 2013-05-07 HISTORY — PX: LIVER BIOPSY: SHX301

## 2013-05-09 ENCOUNTER — Other Ambulatory Visit: Payer: Self-pay | Admitting: Gastroenterology

## 2013-05-12 ENCOUNTER — Other Ambulatory Visit: Payer: Self-pay

## 2013-05-18 ENCOUNTER — Other Ambulatory Visit (HOSPITAL_COMMUNITY): Payer: Self-pay | Admitting: Gastroenterology

## 2013-05-18 DIAGNOSIS — R7989 Other specified abnormal findings of blood chemistry: Secondary | ICD-10-CM

## 2013-05-19 ENCOUNTER — Other Ambulatory Visit: Payer: Self-pay | Admitting: Radiology

## 2013-05-20 ENCOUNTER — Encounter (HOSPITAL_COMMUNITY): Payer: Self-pay | Admitting: Pharmacy Technician

## 2013-05-24 ENCOUNTER — Encounter (HOSPITAL_COMMUNITY): Payer: Self-pay

## 2013-05-24 ENCOUNTER — Ambulatory Visit (HOSPITAL_COMMUNITY)
Admission: RE | Admit: 2013-05-24 | Discharge: 2013-05-24 | Disposition: A | Discharge status: Home / Self Care | Payer: BC Managed Care – PPO | Source: Ambulatory Visit | Attending: Gastroenterology | Admitting: Gastroenterology

## 2013-05-24 DIAGNOSIS — K8309 Other cholangitis: Secondary | ICD-10-CM | POA: Insufficient documentation

## 2013-05-24 DIAGNOSIS — Z01812 Encounter for preprocedural laboratory examination: Secondary | ICD-10-CM | POA: Insufficient documentation

## 2013-05-24 DIAGNOSIS — R748 Abnormal levels of other serum enzymes: Secondary | ICD-10-CM | POA: Insufficient documentation

## 2013-05-24 DIAGNOSIS — B1920 Unspecified viral hepatitis C without hepatic coma: Secondary | ICD-10-CM | POA: Insufficient documentation

## 2013-05-24 DIAGNOSIS — R7989 Other specified abnormal findings of blood chemistry: Secondary | ICD-10-CM

## 2013-05-24 DIAGNOSIS — Z780 Asymptomatic menopausal state: Secondary | ICD-10-CM | POA: Insufficient documentation

## 2013-05-24 LAB — CBC
HCT: 39 % (ref 36.0–46.0)
Hemoglobin: 13 g/dL (ref 12.0–15.0)
MCH: 32.4 pg (ref 26.0–34.0)
MCHC: 33.3 g/dL (ref 30.0–36.0)
MCV: 97.3 fL (ref 78.0–100.0)
Platelets: 156 10*3/uL (ref 150–400)
RBC: 4.01 MIL/uL (ref 3.87–5.11)
RDW: 13.7 % (ref 11.5–15.5)
WBC: 4.8 10*3/uL (ref 4.0–10.5)

## 2013-05-24 LAB — PROTIME-INR
INR: 1.08 (ref 0.00–1.49)
Prothrombin Time: 13.8 seconds (ref 11.6–15.2)

## 2013-05-24 LAB — APTT: aPTT: 34 seconds (ref 24–37)

## 2013-05-24 MED ORDER — MIDAZOLAM HCL 2 MG/2ML IJ SOLN
INTRAMUSCULAR | Status: AC
  Filled 2013-05-24: qty 6

## 2013-05-24 MED ORDER — FENTANYL CITRATE 0.05 MG/ML IJ SOLN
INTRAMUSCULAR | Status: AC | PRN
  Administered 2013-05-24: 50 ug via INTRAVENOUS

## 2013-05-24 MED ORDER — MIDAZOLAM HCL 2 MG/2ML IJ SOLN
INTRAMUSCULAR | Status: AC | PRN
  Administered 2013-05-24: 2 mg via INTRAVENOUS

## 2013-05-24 MED ORDER — SODIUM CHLORIDE 0.9 % IV SOLN
Freq: Once | INTRAVENOUS | Status: AC
  Administered 2013-05-24: 20 mL/h via INTRAVENOUS

## 2013-05-24 MED ORDER — FENTANYL CITRATE 0.05 MG/ML IJ SOLN
INTRAMUSCULAR | Status: AC
  Filled 2013-05-24: qty 4

## 2013-05-24 NOTE — Procedures (Signed)
Successful US guided random liver core biopsy. No complications.  Brayton El PA-C Interventional Radiology 05/24/2013 10:46 AM

## 2013-05-24 NOTE — H&P (Signed)
Michelle Mcmahon is an 57 y.o. female.   Chief Complaint: pt with noted elevated liver enzymes x 2 months Dx Hepatitis C and Primary Biliary Cirrhosis per Dr Elnoria Howard Scheduled now for liver core biopsy HPI: elevated LFTs  Past Medical History  Diagnosis Date  . Menopausal state   . Shoulder bursitis     left, prior steroidal shots at Urgent Care    History reviewed. No pertinent past surgical history.  Family History  Problem Relation Age of Onset  . Other Mother     accidental death  . Cancer Father     died of colon cancer age 8yo  . Heart disease Father     palpitations  . Colon cancer Father 52  . Cancer Brother     bone  . Diabetes Maternal Grandmother   . Heart disease Maternal Grandmother   . Stroke Neg Hx   . Hypertension Neg Hx    Social History:  reports that she quit smoking about 29 years ago. Her smoking use included Cigarettes. She smoked 0.00 packs per day. She has never used smokeless tobacco. She reports that she drinks about 1.8 ounces of alcohol per week. She reports that she does not use illicit drugs.  Allergies: No Known Allergies   (Not in a hospital admission)  No results found for this or any previous visit (from the past 48 hour(s)). No results found.  Review of Systems  Constitutional: Negative for fever, weight loss and malaise/fatigue.  Respiratory: Negative for shortness of breath.   Cardiovascular: Negative for chest pain.  Gastrointestinal: Negative for nausea, vomiting and abdominal pain.  Neurological: Negative for headaches.    Blood pressure 112/61, pulse 52, temperature 98 F (36.7 C), temperature source Oral, resp. rate 20, height 5\' 8"  (1.727 m), weight 185 lb (83.915 kg), SpO2 97.00%. Physical Exam  Constitutional: She is oriented to person, place, and time. She appears well-developed and well-nourished.  Cardiovascular: Normal rate, regular rhythm and normal heart sounds.   No murmur heard. Respiratory: Effort normal and breath  sounds normal. She has no wheezes.  GI: Soft. Bowel sounds are normal. There is no tenderness.  Musculoskeletal: Normal range of motion.  Neurological: She is alert and oriented to person, place, and time.  Skin: Skin is warm and dry.  Psychiatric: She has a normal mood and affect. Her behavior is normal. Judgment and thought content normal.     Assessment/Plan elev LFTs Hep C/ PBC Scheduled for liver core bx today Pt and husband aware of procedure benefits and risks and agreeable to proceed Consent signed and in chart   Reyce Lubeck A 05/24/2013, 9:25 AM

## 2013-06-16 LAB — HM COLONOSCOPY

## 2013-06-28 ENCOUNTER — Encounter: Payer: Self-pay | Admitting: Medical

## 2013-09-15 ENCOUNTER — Other Ambulatory Visit: Payer: Self-pay | Admitting: Medical

## 2013-09-15 NOTE — Telephone Encounter (Signed)
Is this okay to refill? 

## 2013-09-16 NOTE — Telephone Encounter (Signed)
Patient is aware and she will call back to schedule her appointment. CLS

## 2013-09-22 ENCOUNTER — Encounter: Payer: Self-pay | Admitting: Medical

## 2013-09-22 ENCOUNTER — Ambulatory Visit (INDEPENDENT_AMBULATORY_CARE_PROVIDER_SITE_OTHER): Payer: BC Managed Care – PPO | Admitting: Medical

## 2013-09-22 ENCOUNTER — Telehealth: Payer: Self-pay | Admitting: Internal Medicine

## 2013-09-22 VITALS — BP 110/70 | HR 68 | Temp 98.0°F | Resp 17 | Ht 68.0 in | Wt 192.0 lb

## 2013-09-22 DIAGNOSIS — G4700 Insomnia, unspecified: Secondary | ICD-10-CM

## 2013-09-22 DIAGNOSIS — R454 Irritability and anger: Secondary | ICD-10-CM

## 2013-09-22 DIAGNOSIS — N951 Menopausal and female climacteric states: Secondary | ICD-10-CM

## 2013-09-22 DIAGNOSIS — K745 Biliary cirrhosis, unspecified: Secondary | ICD-10-CM

## 2013-09-22 DIAGNOSIS — K743 Primary biliary cirrhosis: Secondary | ICD-10-CM

## 2013-09-22 DIAGNOSIS — N952 Postmenopausal atrophic vaginitis: Secondary | ICD-10-CM

## 2013-09-22 MED ORDER — VENLAFAXINE HCL ER 37.5 MG PO CP24: 37.5000 mg | ORAL_CAPSULE | Freq: Every day | ORAL | Status: DC

## 2013-09-22 NOTE — Telephone Encounter (Signed)
Michelle Mcmahon has request medical records from Dr. Elnoria Howardhung

## 2013-09-22 NOTE — Patient Instructions (Signed)
Thank you for giving me the opportunity to serve you today.    Your diagnosis today includes: Encounter Diagnoses  Name Primary?  . Menopausal state Yes  . Irritability   . Insomnia   . Atrophic vaginitis   . Primary biliary cirrhosis      Specific recommendations today include:  Begin Effexor 1 daily  If you have citalopram labs, take one half tablet every day for 3 days, then one half tablet every other day for one week then stop  Followup with Dr. Algie CofferFogelman about an alternate to the Premarin cream  Followup with Dr. Elnoria HowardHung as usual  You may use ibuprofen periodically for an inflamed lymph node, however if it is staying large we will need to get an ultrasound of your neck  Follow up: 1 month, sooner if needed   I have included other useful information below for your review.  Menopause Menopause is the normal time of life when menstrual periods stop completely. Menopause is complete when you have missed 12 consecutive menstrual periods. It usually occurs between the ages of 48 years and 55 years. Very rarely does a woman develop menopause before the age of 40 years. At menopause, your ovaries stop producing the female hormones estrogen and progesterone. This can cause undesirable symptoms and also affect your health. Sometimes the symptoms may occur 4 5 years before the menopause begins. There is no relationship between menopause and:  Oral contraceptives.  Number of children you had.  Race.  The age your menstrual periods started (menarche). Heavy smokers and very thin women may develop menopause earlier in life. CAUSES  The ovaries stop producing the female hormones estrogen and progesterone.  Other causes include:  Surgery to remove both ovaries.  The ovaries stop functioning for no known reason.  Tumors of the pituitary gland in the brain.  Medical disease that affects the ovaries and hormone production.  Radiation treatment to the abdomen or  pelvis.  Chemotherapy that affects the ovaries. SYMPTOMS   Hot flashes.  Night sweats.  Decrease in sex drive.  Vaginal dryness and thinning of the vagina causing painful intercourse.  Dryness of the skin and developing wrinkles.  Headaches.  Tiredness.  Irritability.  Memory problems.  Weight gain.  Bladder infections.  Hair growth of the face and chest.  Infertility. More serious symptoms include:  Loss of bone (osteoporosis) causing breaks (fractures).  Depression.  Hardening and narrowing of the arteries (atherosclerosis) causing heart attacks and strokes. DIAGNOSIS   When the menstrual periods have stopped for 12 straight months.  Physical exam.  Hormone studies of the blood. TREATMENT  There are many treatment choices and nearly as many questions about them. The decisions to treat or not to treat menopausal changes is an individual choice made with your health care provider. Your health care provider can discuss the treatments with you. Together, you can decide which treatment will work best for you. Your treatment choices may include:   Hormone therapy (estrogen and progesterone).  Non-hormonal medicines.  Treating the individual symptoms with medicine (for example antidepressants for depression).  Herbal medicines that may help specific symptoms.  Counseling by a psychiatrist or psychologist.  Group therapy.  Lifestyle changes including:  Eating healthy.  Regular exercise.  Limiting caffeine and alcohol.  Stress management and meditation.  No treatment. HOME CARE INSTRUCTIONS   Take the medicine your health care provider gives you as directed.  Get plenty of sleep and rest.  Exercise regularly.  Eat a diet that  contains calcium (good for the bones) and soy products (acts like estrogen hormone).  Avoid alcoholic beverages.  Do not smoke.  If you have hot flashes, dress in layers.  Take supplements, calcium, and vitamin D to  strengthen bones.  You can use over-the-counter lubricants or moisturizers for vaginal dryness.  Group therapy is sometimes very helpful.  Acupuncture may be helpful in some cases. SEEK MEDICAL CARE IF:   You are not sure you are in menopause.  You are having menopausal symptoms and need advice and treatment.  You are still having menstrual periods after age 52 years.  You have pain with intercourse.  Menopause is complete (no menstrual period for 12 months) and you develop vaginal bleeding.  You need a referral to a specialist (gynecologist, psychiatrist, or psychologist) for treatment. SEEK IMMEDIATE MEDICAL CARE IF:   You have severe depression.  You have excessive vaginal bleeding.  You fell and think you have a broken bone.  You have pain when you urinate.  You develop leg or chest pain.  You have a fast pounding heart beat (palpitations).  You have severe headaches.  You develop vision problems.  You feel a lump in your breast.  You have abdominal pain or severe indigestion. Document Released: 09/13/2003 Document Revised: 02/23/2013 Document Reviewed: 01/20/2013 [facility] Patient Information 2014 [place], [place].

## 2013-09-22 NOTE — Progress Notes (Signed)
   Subjective:   Brandt LoosenRobin Camps is a 58 y.o. female presenting on 09/22/2013 with follow up  Her for followup on menopausal symptoms.  Celexa 40 mg a day is not working. Currently having night sweats, hot flashes, irritability, sleep problems, weight gain.  LMP over a year ago.  We started the medicaoint last year for irritability and menopausal symptoms.  She is a nonsmoker.    Since her last visit here she saw gynecology, had polyp removed, was diagnosed with atrophic vaginitis but couldn't afford the Premarin cream.  She has also been diagnosed with primary biliary cirrhosis by Dr. Elnoria Howardhung, is having routine followup with him and labs, started on Ursdiol.  No other aggravating or relieving factors.    He has a left neck lymph node that gets inflamed periodically, but not particularly large.  No fevers, no weight loss. No allergy or sinus symptoms.  No other complaint.  Review of Systems ROS as in subjective      Objective:     Filed Vitals:   09/22/13 1037  BP: 110/70  Pulse: 68  Temp: 98 F (36.7 C)  Resp: 17    General appearance: alert, no distress, WD/WN HEENT: normocephalic, sclerae anicteric, TMs pearly, nares patent, no discharge or erythema, pharynx normal Oral cavity: MMM, no lesions Neck: supple, shotty tender left anterior cervical lymph node, otherwise no lymphadenopathy, no thyromegaly, no masses Heart: RRR, normal S1, S2, no murmurs Lungs: CTA bilaterally, no wheezes, rhonchi, or rales Pulses: 2+ symmetric, upper and lower extremities, normal cap refill      Assessment: Encounter Diagnoses  Name Primary?  . Menopausal state Yes  . Irritability   . Insomnia   . Atrophic vaginitis   . Primary biliary cirrhosis      Plan: Discussed her symptoms and concerns.  We will request most recent labs and office notes from Dr. Elnoria HowardHung.  We will stop her off of Celexa and begin Effexor discussed risk and benefits of Effexor.  Discussed other options including HRT  and estrogen cream for atrophic vaginitis.  She will follow with Dr. Elnoria Howardhung as usual for cirrhosis, will follow Dr. Algie CofferFogelman for gynecology regarding atrophic vaginitis since he she couldn't afford Premarin cream.  We'll use a trial of Effexor and see her back in one month.  Discussed the shotty lymph nodes, we'll get a copy of her most recent CBC, can use ibuprofen when necessary sparingly   [name] was seen today for follow up.  Diagnoses and associated orders for this visit:  Menopausal state  Irritability  Insomnia  Atrophic vaginitis  Primary biliary cirrhosis  Other Orders - venlafaxine XR (EFFEXOR XR) 37.5 MG 24 hr capsule; Take 1 capsule (37.5 mg total) by mouth daily with breakfast.    Return pending labs, records, f/u 34mo.

## 2013-09-29 ENCOUNTER — Encounter: Payer: Self-pay | Admitting: Internal Medicine

## 2013-10-03 ENCOUNTER — Encounter: Payer: Self-pay | Admitting: Medical

## 2013-10-24 ENCOUNTER — Ambulatory Visit (INDEPENDENT_AMBULATORY_CARE_PROVIDER_SITE_OTHER): Payer: BC Managed Care – PPO | Admitting: Medical

## 2013-10-24 ENCOUNTER — Encounter: Payer: Self-pay | Admitting: Medical

## 2013-10-24 VITALS — BP 102/80 | HR 60 | Temp 98.3°F | Wt 190.0 lb

## 2013-10-24 DIAGNOSIS — N951 Menopausal and female climacteric states: Secondary | ICD-10-CM

## 2013-10-24 DIAGNOSIS — E663 Overweight: Secondary | ICD-10-CM

## 2013-10-24 MED ORDER — VENLAFAXINE HCL ER 75 MG PO CP24: 75.0000 mg | ORAL_CAPSULE | Freq: Every day | ORAL | Status: DC

## 2013-10-24 NOTE — Progress Notes (Signed)
  Subjective:   Brandt LoosenRobin Krall is a 58 y.o. female presenting on 10/24/2013 with Follow-up  Her for followup on menopausal symptoms.   Last visit we changed to Effexor as Celexa 40 mg a day was not working.   So far better response with Effexor.   Overall it is helping but wants to see if we can increase dose a little.   Symptoms primarily have been night sweats, hot flashes, irritability, sleep problems, weight gain.  LMP over a year ago.  We started the medication last year for irritability and menopausal symptoms.  She is a nonsmoker.    She has primary biliary cirrhosis by Dr. Elnoria Howardhung, is having routine followup with him and labs, started on Ursdiol.  No other aggravating or relieving factors.    No other complaint.  Review of Systems ROS as in subjective        Objective:     Filed Vitals:   10/24/13 1046  BP: 102/80  Pulse: 60  Temp: 98.3 F (36.8 C)    General appearance: alert, no distress, WD/WN Psych: pleasant, normal affect     Assessment: Encounter Diagnoses  Name Primary?  . Menopausal symptom Yes  . Overweight      Plan: Increase to Effexor 75mg  XR daily.  Discussed her concerns, symptoms which are improved after changing from  Celexa.  overwight - advised stricter diet, c/t exercise.   Can consider weight loss medication if Dr. Elnoria HowardHung feels it is safe and if she is showing better diet control.  [name] was seen today for follow-up.  Diagnoses and associated orders for this visit:  Menopausal symptom  Overweight  Other Orders - venlafaxine XR (EFFEXOR XR) 75 MG 24 hr capsule; Take 1 capsule (75 mg total) by mouth daily with breakfast.    Return call back in 2-3 wk..Marland Kitchen

## 2013-10-24 NOTE — Patient Instructions (Signed)
  Thank you for giving me the opportunity to serve you today.    Your diagnosis today includes: Encounter Diagnoses  Name Primary?  . Menopausal symptom Yes  . Overweight      Specific recommendations today include:  We increased to Effexor 75mg  XR today  Make sure you talk to Dr. Elnoria HowardHung about this medication and possibly using other medications such as clonidine for hot flashes or HRT for menopause.  Also, you can get his opinion about Qsymia or Phentermine for weight loss   Return call back in 2-3 wk..Marland Kitchen

## 2013-10-25 ENCOUNTER — Telehealth: Payer: Self-pay | Admitting: Medical

## 2013-10-25 NOTE — Telephone Encounter (Signed)
pls let her know that I spoke with [facility] on [address] yesterday and they do compound estrogen vaginal cream and it is very reasonable on cost.   Have her call Dr. Elpidio EricFogleman's office and see if they will prescribe since Premarin was too expensive.

## 2013-10-26 NOTE — Telephone Encounter (Signed)
LMOM TO CB. CLS 

## 2013-10-26 NOTE — Telephone Encounter (Signed)
PATIENT IS AWARE OF Michelle Mcmahon PAC MESSAGE. CLS

## 2013-11-17 ENCOUNTER — Other Ambulatory Visit: Payer: Self-pay | Admitting: Medical

## 2013-11-17 NOTE — Telephone Encounter (Signed)
Patient should be on 75mg  XR daily per Shane's last note.

## 2014-02-09 ENCOUNTER — Other Ambulatory Visit: Payer: Self-pay | Admitting: Medical

## 2014-03-07 ENCOUNTER — Other Ambulatory Visit: Payer: Self-pay | Admitting: Medical

## 2014-03-07 NOTE — Telephone Encounter (Signed)
Is this okay to refill? 

## 2014-03-23 ENCOUNTER — Encounter: Payer: Self-pay | Admitting: Medical

## 2014-03-23 ENCOUNTER — Ambulatory Visit (INDEPENDENT_AMBULATORY_CARE_PROVIDER_SITE_OTHER): Payer: BC Managed Care – PPO | Admitting: Medical

## 2014-03-23 VITALS — BP 118/78 | HR 76 | Temp 98.3°F | Resp 16 | Wt 195.0 lb

## 2014-03-23 DIAGNOSIS — J0100 Acute maxillary sinusitis, unspecified: Secondary | ICD-10-CM

## 2014-03-23 MED ORDER — AMOXICILLIN 875 MG PO TABS: 875.0000 mg | ORAL_TABLET | Freq: Two times a day (BID) | ORAL | Status: DC

## 2014-03-23 NOTE — Progress Notes (Signed)
Subjective:  Michelle Mcmahon is a 58 y.o. female who presents for possible sinus infection.  Has hx/o sinusitis about once yearly this time of year.  Symptoms include about a week or more of sore throat, sinus pressure, drainage, cough, low grade fever.  Denies NVD, ear pain, teeth pain.  Doesn't normally have allergy problems this time of year.  Patient is a non-smoker.  Using flurbiprofen for symptoms.  Denies sick contacts.  She is also concerned as she is going to visit her family who is expecting their first baby/her grandchild this weekend and will be helping take care of them.  No other aggravating or relieving factors.  No other c/o.  ROS as in subjective   Objective: Filed Vitals:   03/23/14 1025  BP: 118/78  Pulse: 76  Temp: 98.3 F (36.8 C)  Resp: 16    General appearance: Alert, WD/WN, no distress, stopped up sounding                             Skin: warm, no rash                           Head: + maxillary sinus tenderness                            Eyes: conjunctiva normal, corneas clear, PERRLA                            Ears: pearly TMs, external ear canals normal                          Nose: septum midline, turbinates swollen, with erythema and mucoid discharge             Mouth/throat: MMM, tongue normal, mild pharyngeal erythema                           Neck: supple, no adenopathy, no thyromegaly, nontender                          Heart: RRR, normal S1, S2, no murmurs                         Lungs: CTA bilaterally, no wheezes, rales, or rhonchi      Assessment and Plan: Encounter Diagnosis  Name Primary?  . Acute maxillary sinusitis, recurrence not specified Yes   Prescription given for Amoxicillin. Can use OTC Mucinex for congestion.  Tylenol or Ibuprofen OTC for fever and malaise.  Discussed symptomatic relief, nasal saline flush, and call or return if worse or not improving in 2-3 days.  discussed precautions around the new baby.

## 2014-03-29 ENCOUNTER — Telehealth: Payer: Self-pay | Admitting: Medical

## 2014-03-29 MED ORDER — AMOXICILLIN-POT CLAVULANATE 875-125 MG PO TABS: 1.0000 | ORAL_TABLET | Freq: Two times a day (BID) | ORAL | Status: DC

## 2014-03-29 NOTE — Telephone Encounter (Signed)
PT INFORMED WORD FOR WORD SHE VERBALIZED UNDERSTANDING 

## 2014-03-29 NOTE — Telephone Encounter (Signed)
She has found no improvement on Amoxil and I will therefore switch her to Augmentin

## 2014-03-29 NOTE — Telephone Encounter (Signed)
Let her know that I switched her to a more potent amoxicillin.

## 2014-05-04 ENCOUNTER — Other Ambulatory Visit: Payer: Self-pay | Admitting: Medical

## 2014-05-30 ENCOUNTER — Other Ambulatory Visit: Payer: Self-pay | Admitting: Medical

## 2014-05-30 NOTE — Telephone Encounter (Signed)
Is this okay to be refill ?

## 2014-06-13 ENCOUNTER — Encounter: Payer: Self-pay | Admitting: Medical

## 2014-06-13 ENCOUNTER — Ambulatory Visit (INDEPENDENT_AMBULATORY_CARE_PROVIDER_SITE_OTHER): Payer: BC Managed Care – PPO | Admitting: Medical

## 2014-06-13 VITALS — BP 122/80 | HR 66 | Temp 98.3°F | Resp 16 | Wt 193.0 lb

## 2014-06-13 DIAGNOSIS — R0982 Postnasal drip: Secondary | ICD-10-CM

## 2014-06-13 DIAGNOSIS — R053 Chronic cough: Secondary | ICD-10-CM

## 2014-06-13 DIAGNOSIS — N951 Menopausal and female climacteric states: Secondary | ICD-10-CM

## 2014-06-13 DIAGNOSIS — F430 Acute stress reaction: Secondary | ICD-10-CM

## 2014-06-13 DIAGNOSIS — J329 Chronic sinusitis, unspecified: Secondary | ICD-10-CM

## 2014-06-13 DIAGNOSIS — R05 Cough: Secondary | ICD-10-CM

## 2014-06-13 MED ORDER — ALPRAZOLAM 0.5 MG PO TABS: 0.5000 mg | ORAL_TABLET | Freq: Every evening | ORAL | Status: DC | PRN

## 2014-06-13 MED ORDER — VENLAFAXINE HCL ER 150 MG PO CP24: 150.0000 mg | ORAL_CAPSULE | Freq: Every day | ORAL | Status: DC

## 2014-06-13 NOTE — Progress Notes (Signed)
Subjective:  Michelle LoosenRobin Mcmahon is a 58 y.o. female who presents for cough. I saw her in September for sinusitis, took Augmentin then.   She has had persistent cough for 3 months.   Has productive cough, yellow phlegm, been having some sinus pressure, headaches.  Today main c/o is cough.  Sinus pressure a little better.  No hx/o asthma, or other lung disease.  Patient is a non-smoker.  Using nothing for symptoms.  Denies sick contacts.    She also reports a lot of stress right now at home. Her husband's parents which live with them are both dying, both in their 3490s, and there are starting to work on the estate planning. There are 4 siblings and are are arguing over who gets what.  She has been doubling up on the Effexor, still feels overly anxious and stressed.  No other aggravating or relieving factors.  No other c/o.  ROS as in subjective    Objective: Filed Vitals:   06/13/14 1114  BP: 122/80  Pulse: 66  Temp: 98.3 F (36.8 C)  Resp: 16    General appearance: Alert, WD/WN, no distress                             Skin: warm, no rash                           Head: No sinus tenderness,                            Eyes: conjunctiva normal, corneas clear, PERRLA                            Ears: pearly TMs, external ear canals normal                          Nose: septum midline, turbinates swollen, with erythema and clear discharge             Mouth/throat: MMM, tongue normal, mild pharyngeal erythema                           Neck: supple, no adenopathy, no thyromegaly, nontender                          Heart: RRR, normal S1, S2, no murmurs                         Lungs: CTA bilaterally, no wheezes, rales, or rhonchi      Assessment and Plan: Encounter Diagnoses  Name Primary?  . Chronic cough Yes  . Post-nasal drainage   . Menopausal state   . Acute stress reaction    Discussed causes of chronic cough, and in her case likely postnasal drainage.  Advised 2 weeks of Benadryl and cough  suppressant but she declines both. She doesn't seem to be all that worried about a cough, mainly came in on the urging of her husband.  Offered chest x-ray but she declines for now. If still coughing within the next 3-4 weeks she will get a chest x-ray  Given her acute stress issues, discussed this, discussed her concerns, increase Effexor today, Xanax given when necessary use, discussed  risk and benefits of medication.   Return in about 1 month (around 07/14/2014).

## 2014-06-29 ENCOUNTER — Other Ambulatory Visit: Payer: Self-pay | Admitting: Medical

## 2014-08-10 ENCOUNTER — Other Ambulatory Visit: Payer: Self-pay | Admitting: Medical

## 2014-08-10 NOTE — Telephone Encounter (Signed)
Shane is this okay to refill 

## 2014-08-14 ENCOUNTER — Other Ambulatory Visit: Payer: Self-pay | Admitting: Medical

## 2014-08-14 NOTE — Telephone Encounter (Signed)
Call out 58mo of xanax

## 2014-08-14 NOTE — Telephone Encounter (Signed)
Is this okay to refill? 

## 2014-08-14 NOTE — Telephone Encounter (Signed)
I called out her medication to her pharmacy per Crosby Oysteravid Tysinger PA

## 2014-09-29 ENCOUNTER — Other Ambulatory Visit: Payer: Self-pay | Admitting: Medical

## 2014-10-02 ENCOUNTER — Other Ambulatory Visit: Payer: Self-pay | Admitting: Medical

## 2014-11-28 ENCOUNTER — Other Ambulatory Visit: Payer: Self-pay | Admitting: Medical

## 2014-11-28 NOTE — Telephone Encounter (Signed)
I left a message with the patients husband

## 2014-11-28 NOTE — Telephone Encounter (Signed)
Patient is due for a physical. Please, schedule your appointment

## 2014-11-28 NOTE — Telephone Encounter (Signed)
Refill Effexor and set up for physical.  Last complete physical 2014.

## 2014-11-28 NOTE — Telephone Encounter (Signed)
Is this okay to refill? Last OV was 06/2014

## 2014-12-26 ENCOUNTER — Other Ambulatory Visit: Payer: Self-pay | Admitting: Medical

## 2014-12-26 ENCOUNTER — Telehealth: Payer: Self-pay | Admitting: Medical

## 2014-12-26 NOTE — Telephone Encounter (Signed)
Is this okay to refill? 

## 2014-12-26 NOTE — Telephone Encounter (Signed)
Needs appt, or physical if last one over 1 year ago.  Can call out 64mo on medication

## 2014-12-27 ENCOUNTER — Other Ambulatory Visit: Payer: Self-pay | Admitting: Family Medicine

## 2014-12-27 MED ORDER — VENLAFAXINE HCL ER 75 MG PO CP24: 75.0000 mg | ORAL_CAPSULE | Freq: Every day | ORAL | Status: DC

## 2014-12-27 NOTE — Telephone Encounter (Signed)
I called and spoke with the patient and made her aware she needs an appointment.

## 2014-12-27 NOTE — Telephone Encounter (Signed)
Patient has an appointment scheduled for 01/01/15 for a physical

## 2015-01-01 ENCOUNTER — Encounter: Payer: Self-pay | Admitting: Medical

## 2015-01-01 ENCOUNTER — Ambulatory Visit (INDEPENDENT_AMBULATORY_CARE_PROVIDER_SITE_OTHER): Payer: 59 | Admitting: Medical

## 2015-01-01 ENCOUNTER — Telehealth: Payer: Self-pay | Admitting: Medical

## 2015-01-01 VITALS — BP 102/80 | HR 68 | Temp 97.9°F | Resp 14 | Ht 68.5 in | Wt 198.0 lb

## 2015-01-01 DIAGNOSIS — K743 Primary biliary cirrhosis: Secondary | ICD-10-CM | POA: Diagnosis not present

## 2015-01-01 DIAGNOSIS — Z23 Encounter for immunization: Secondary | ICD-10-CM | POA: Diagnosis not present

## 2015-01-01 DIAGNOSIS — N951 Menopausal and female climacteric states: Secondary | ICD-10-CM

## 2015-01-01 DIAGNOSIS — N952 Postmenopausal atrophic vaginitis: Secondary | ICD-10-CM

## 2015-01-01 DIAGNOSIS — E663 Overweight: Secondary | ICD-10-CM | POA: Diagnosis not present

## 2015-01-01 DIAGNOSIS — Z0000 Encounter for general adult medical examination without abnormal findings: Secondary | ICD-10-CM

## 2015-01-01 LAB — BASIC METABOLIC PANEL
BUN: 15 mg/dL (ref 6–23)
CO2: 28 mEq/L (ref 19–32)
Calcium: 9.5 mg/dL (ref 8.4–10.5)
Chloride: 104 mEq/L (ref 96–112)
Creat: 0.72 mg/dL (ref 0.50–1.10)
Glucose, Bld: 97 mg/dL (ref 70–99)
Potassium: 4.3 mEq/L (ref 3.5–5.3)
Sodium: 138 mEq/L (ref 135–145)

## 2015-01-01 LAB — POCT URINALYSIS DIPSTICK
Bilirubin, UA: NEGATIVE
Blood, UA: NEGATIVE
Glucose, UA: NEGATIVE
Ketones, UA: NEGATIVE
Leukocytes, UA: NEGATIVE
Nitrite, UA: NEGATIVE
Protein, UA: NEGATIVE
Spec Grav, UA: >=1.03
Urobilinogen, UA: NEGATIVE
pH, UA: 6

## 2015-01-01 LAB — HEPATIC FUNCTION PANEL
ALT: 17 U/L (ref 0–35)
AST: 24 U/L (ref 0–37)
Albumin: 4.1 g/dL (ref 3.5–5.2)
Alkaline Phosphatase: 95 U/L (ref 39–117)
Bilirubin, Direct: 0.1 mg/dL (ref 0.0–0.3)
Indirect Bilirubin: 0.5 mg/dL (ref 0.2–1.2)
Total Bilirubin: 0.6 mg/dL (ref 0.2–1.2)
Total Protein: 8.1 g/dL (ref 6.0–8.3)

## 2015-01-01 LAB — CBC
HCT: 40.5 % (ref 36.0–46.0)
Hemoglobin: 13.7 g/dL (ref 12.0–15.0)
MCH: 31.9 pg (ref 26.0–34.0)
MCHC: 33.8 g/dL (ref 30.0–36.0)
MCV: 94.4 fL (ref 78.0–100.0)
MPV: 11.7 fL (ref 8.6–12.4)
Platelets: 199 10*3/uL (ref 150–400)
RBC: 4.29 MIL/uL (ref 3.87–5.11)
RDW: 13.2 % (ref 11.5–15.5)
WBC: 5.5 10*3/uL (ref 4.0–10.5)

## 2015-01-01 MED ORDER — PHENTERMINE HCL 37.5 MG PO TABS: 37.5000 mg | ORAL_TABLET | Freq: Every day | ORAL | Status: DC

## 2015-01-01 NOTE — Telephone Encounter (Signed)
Pt stopped by to let me know her new pharmacy is [facility].

## 2015-01-01 NOTE — Progress Notes (Signed)
Subjective:   HPI  Michelle Mcmahon is a 59 y.o. female who presents for a complete physical.  Medical care team includes:  Dr. Jeani Hawking, GI  Dr. Noland Fordyce, gynecology [facility], PA-C here for primary care   Preventative care: Last ophthalmology visit:n/a Last dental visit:yes unsure of the name Last colonoscopy:12/ 2015 Last mammogram:03/2013 Last gynecological exam: see's GYN Last EKG:n/a Last labs: ?  Prior vaccinations: TD or Tdap:within the past 10 years Influenza: 2015 Pneumococcal:n/a Shingles/Zostavax:n/a  Concerns: Wants to start phentermine for weight loss.  Her daughter has taken with good luck.  She would like to get back down to her normal weight in the 160s.  Is exercising most days with walking or running.   Eating healthy but can't seem to lose weight.   Reviewed their medical, surgical, family, social, medication, and allergy history and updated chart as appropriate.  Past Medical History  Diagnosis Date  . Menopausal state     started Citalopram 2014   . Shoulder bursitis     left, prior steroidal shots at [facility]  . 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 H/O mammogram 01/2013    normal  . Routine gynecological examination 04/2013    Dr. Ernestina Penna  . Atrophic vaginitis     04/2013  . Cervical polyp 04/2013    Dr. Ernestina Penna, biopsied   . Abnormal liver function test     consult with Dr. 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 Primary biliary cirrhosis 2014    Dr. Elnoria Howard    Past Surgical History  Procedure Laterality Date  . Liver biopsy  05/2013  . Colonoscopy  04/2013    tubular adenoma, serrated adenoma, Dr. Elnoria Howard; repeat q3 years  . Cervical biopsy  04/2013    Dr. Ernestina Penna, gynecology    History   Social History  . Marital Status: Married    Spouse Name: N/A  . Number of Children: N/A  . Years of Education: N/A   Occupational History  . Not on file.   Social History Main  Topics  . Smoking status: Former Smoker -- 1.00 packs/day for 20 years    Types: Cigarettes    Quit date: 07/08/1983  . Smokeless tobacco: Never Used  . Alcohol Use: 1.8 oz/week    3 Glasses of wine per week  . Drug Use: No  . Sexual Activity: Not on file   Other Topics Concern  . 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 Other Mother     accidental death  . Cancer Father     died of colon cancer age 58yo  . Heart disease Father     palpitations  . Colon cancer Father 47  . Cancer Brother     bone  . Diabetes Maternal Grandmother   . Heart disease Maternal Grandmother   . Stroke Neg Hx   . Hypertension Neg Hx      Current outpatient prescriptions:  .  ALPRAZolam (XANAX) 0.5 MG tablet, TAKE 1 TABLET BY MOUTH AT BEDTIME AS NEEDED, Disp: 30 tablet, Rfl: 0 .  ibuprofen (ADVIL,MOTRIN) 200 MG tablet, Take 400 mg by mouth daily as needed (pain)., Disp: , Rfl:  .  Multiple Vitamin (MULTIVITAMIN WITH MINERALS) TABS tablet, Take 1 tablet by mouth at bedtime.,  Disp: , Rfl:  .  ursodiol (ACTIGALL) 300 MG capsule, Take by mouth 2 (two) times daily. , Disp: , Rfl:  .  venlafaxine XR (EFFEXOR-XR) 75 MG 24 hr capsule, TAKE 2 CAPSULES BY MOUTH EVERY DAY WITH FOOD, Disp: 60 capsule, Rfl: 0  No Known Allergies   Review of Systems Constitutional: -fever, -chills, -sweats, -unexpected weight change, -decreased appetite, -fatigue Allergy: -sneezing, -itching, -congestion Dermatology: -changing moles, --rash, -lumps ENT: -runny nose, -ear pain, -sore throat, -hoarseness, -sinus pain, -teeth pain, - ringing in ears, -hearing loss, -nosebleeds Cardiology: -chest pain, -palpitations, -swelling, -difficulty breathing when lying flat, -waking up short of breath Respiratory: -cough, -shortness of breath, -difficulty breathing with exercise or exertion,  -wheezing, -coughing up blood Gastroenterology: -abdominal pain, -nausea, -vomiting, -diarrhea, -constipation, -blood in stool, -changes in bowel movement, -difficulty swallowing or eating Hematology: -bleeding, -bruising  Musculoskeletal: -joint aches, -muscle aches, -joint swelling, -back pain, -neck pain, -cramping, -changes in gait Ophthalmology: denies vision changes, eye redness, itching, discharge Urology: -burning with urination, -difficulty urinating, -blood in urine, -urinary frequency, -urgency, -incontinence Neurology: -headache, -weakness, -tingling, -numbness, -memory loss, -falls, -dizziness Psychology: -depressed mood, -agitation, -sleep problems     Objective:   Physical Exam  BP 102/80 mmHg  Pulse 68  Temp(Src) 97.9 F (36.6 C) (Oral)  Resp 14  Ht 5' 8.5" (1.74 m)  Wt 198 lb (89.812 kg)  BMI 29.66 kg/m2  General appearance: alert, no distress, WD/WN, pleasant white female Skin: left chest wall superiorly laterally with 56mm raised round pink verruca lesion, otherwise scattered benign macules and papular lesions chest, back, no worrisome lesions HEENT: normocephalic, conjunctiva/corneas normal, sclerae anicteric, PERRLA, EOMi, nares patent, no discharge or erythema, pharynx normal Oral cavity: MMM, tongue normal, teeth in good repair Neck: supple, no lymphadenopathy, no thyromegaly, no masses, normal ROM, no bruits Chest: non tender, normal shape and expansion Heart: RRR, normal S1, S2, no murmurs Lungs: CTA bilaterally, no wheezes, rhonchi, or rales Abdomen: +bs, soft, non tender, non distended, no masses, no hepatomegaly, no splenomegaly, no bruits Back: non tender, normal ROM, no scoliosis Musculoskeletal: upper extremities non tender, no obvious deformity, normal ROM throughout, lower extremities non tender, no obvious deformity, normal ROM throughout Extremities: no edema, no cyanosis, no clubbing Pulses: 2+ symmetric, upper and lower extremities, normal cap  refill Neurological: alert, oriented x 3, CN2-12 intact, strength normal upper extremities and lower extremities, sensation normal throughout, DTRs 2+ throughout, no cerebellar signs, gait normal Psychiatric: normal affect, behavior normal, pleasant  Breast/gyn/rectal - deferred to gynecology   Adult ECG Report  Indication: physical  Rate: 61 bpm  Rhythm: normal sinus rhythm  QRS Axis: 40 degrees  PR Interval:  QRS Duration: 67ms  QTc:  Conduction Disturbances: none  Other Abnormalities: none  Patient's cardiac risk factors are: none.  EKG comparison: none  Narrative Interpretation: normal EKG     Assessment and Plan :    Encounter Diagnoses  Name Primary?  . Adult general medical exam Yes  . Overweight   . Menopausal state   . Primary biliary cirrhosis   . Atrophic vaginitis   . Need for prophylactic vaccination and inoculation against viral hepatitis    Physical exam - discussed healthy lifestyle, diet, exercise, preventative care, vaccinations, and addressed their concerns.  Handout given. See your eye doctor yearly for routine vision care. See your dentist yearly for routine dental care including hygiene visits twice yearly. See your gynecologist yearly for routine gynecological care. C/t routine f/u with Dr. Bernarda Caffey  Overweight - begin trial of phentermine.  discussed risks/benefits, f/u . Discussed exercise, diet, and recheck 4-6 wk.   Counseled on the Hepatitis B virus vaccine.  Vaccine information sheet given.  Hepatitis B vaccine #2 given after consent obtained.  Patient was advised to return in 6 months for Hep B #3.   advised flu shot yearly. She declines Hep A vaccine . She notes being UTD on Tdap.  Follow-up pending labs

## 2015-01-01 NOTE — Telephone Encounter (Signed)
I changed the pharmacy in the system

## 2015-01-02 LAB — HEMOGLOBIN A1C
Hgb A1c MFr Bld: 5.5 % (ref <5.7)
Mean Plasma Glucose: 111 mg/dL (ref <117)

## 2015-01-18 ENCOUNTER — Telehealth: Payer: Self-pay | Admitting: Internal Medicine

## 2015-01-18 NOTE — Telephone Encounter (Signed)
Pharmacy called about Effexor XR- 2 capsules a day and it was sent in as 1 capsule daily. Please verify and send in correct rx

## 2015-01-19 ENCOUNTER — Other Ambulatory Visit: Payer: Self-pay | Admitting: Family Medicine

## 2015-01-19 MED ORDER — VENLAFAXINE HCL ER 75 MG PO CP24: 75.0000 mg | ORAL_CAPSULE | Freq: Every day | ORAL | Status: DC

## 2015-01-19 NOTE — Telephone Encounter (Signed)
I called out refills to the pharmacy on the patients medications

## 2015-01-19 NOTE — Telephone Encounter (Signed)
I refilled what was in the computer which shouldn't have been different.  Please check on this and correct, coordinate with pharmacy

## 2015-01-30 ENCOUNTER — Emergency Department (HOSPITAL_COMMUNITY): Payer: 59

## 2015-01-30 ENCOUNTER — Encounter (HOSPITAL_COMMUNITY): Payer: Self-pay | Admitting: Emergency Medicine

## 2015-01-30 ENCOUNTER — Emergency Department (HOSPITAL_COMMUNITY)
Admission: EM | Admit: 2015-01-30 | Discharge: 2015-01-31 | Disposition: A | Discharge status: Home / Self Care | Payer: 59 | Attending: Emergency Medicine | Admitting: Emergency Medicine

## 2015-01-30 DIAGNOSIS — Z8739 Personal history of other diseases of the musculoskeletal system and connective tissue: Secondary | ICD-10-CM | POA: Diagnosis not present

## 2015-01-30 DIAGNOSIS — S60519A Abrasion of unspecified hand, initial encounter: Secondary | ICD-10-CM

## 2015-01-30 DIAGNOSIS — Y9289 Other specified places as the place of occurrence of the external cause: Secondary | ICD-10-CM | POA: Insufficient documentation

## 2015-01-30 DIAGNOSIS — S60511A Abrasion of right hand, initial encounter: Secondary | ICD-10-CM | POA: Diagnosis not present

## 2015-01-30 DIAGNOSIS — Z8719 Personal history of other diseases of the digestive system: Secondary | ICD-10-CM | POA: Diagnosis not present

## 2015-01-30 DIAGNOSIS — W19XXXA Unspecified fall, initial encounter: Secondary | ICD-10-CM

## 2015-01-30 DIAGNOSIS — S0990XA Unspecified injury of head, initial encounter: Secondary | ICD-10-CM | POA: Diagnosis not present

## 2015-01-30 DIAGNOSIS — Z23 Encounter for immunization: Secondary | ICD-10-CM | POA: Insufficient documentation

## 2015-01-30 DIAGNOSIS — M25522 Pain in left elbow: Secondary | ICD-10-CM

## 2015-01-30 DIAGNOSIS — W108XXA Fall (on) (from) other stairs and steps, initial encounter: Secondary | ICD-10-CM | POA: Insufficient documentation

## 2015-01-30 DIAGNOSIS — S0591XA Unspecified injury of right eye and orbit, initial encounter: Secondary | ICD-10-CM | POA: Insufficient documentation

## 2015-01-30 DIAGNOSIS — Z79899 Other long term (current) drug therapy: Secondary | ICD-10-CM | POA: Insufficient documentation

## 2015-01-30 DIAGNOSIS — S0011XA Contusion of right eyelid and periocular area, initial encounter: Secondary | ICD-10-CM

## 2015-01-30 DIAGNOSIS — Y9389 Activity, other specified: Secondary | ICD-10-CM | POA: Insufficient documentation

## 2015-01-30 DIAGNOSIS — S29001A Unspecified injury of muscle and tendon of front wall of thorax, initial encounter: Secondary | ICD-10-CM | POA: Diagnosis not present

## 2015-01-30 DIAGNOSIS — Z87891 Personal history of nicotine dependence: Secondary | ICD-10-CM | POA: Diagnosis not present

## 2015-01-30 DIAGNOSIS — S52122A Displaced fracture of head of left radius, initial encounter for closed fracture: Secondary | ICD-10-CM

## 2015-01-30 DIAGNOSIS — S6992XA Unspecified injury of left wrist, hand and finger(s), initial encounter: Secondary | ICD-10-CM | POA: Diagnosis present

## 2015-01-30 DIAGNOSIS — Z8742 Personal history of other diseases of the female genital tract: Secondary | ICD-10-CM | POA: Diagnosis not present

## 2015-01-30 DIAGNOSIS — Y998 Other external cause status: Secondary | ICD-10-CM | POA: Diagnosis not present

## 2015-01-30 DIAGNOSIS — S0012XA Contusion of left eyelid and periocular area, initial encounter: Secondary | ICD-10-CM

## 2015-01-30 LAB — PROTIME-INR
INR: 1.13 (ref 0.00–1.49)
Prothrombin Time: 14.7 seconds (ref 11.6–15.2)

## 2015-01-30 LAB — CBC WITH DIFFERENTIAL/PLATELET
Basophils Absolute: 0 10*3/uL (ref 0.0–0.1)
Basophils Relative: 0 % (ref 0–1)
Eosinophils Absolute: 0 10*3/uL (ref 0.0–0.7)
Eosinophils Relative: 0 % (ref 0–5)
HCT: 37.7 % (ref 36.0–46.0)
Hemoglobin: 12.7 g/dL (ref 12.0–15.0)
Lymphocytes Relative: 22 % (ref 12–46)
Lymphs Abs: 2 10*3/uL (ref 0.7–4.0)
MCH: 32.7 pg (ref 26.0–34.0)
MCHC: 33.7 g/dL (ref 30.0–36.0)
MCV: 97.2 fL (ref 78.0–100.0)
Monocytes Absolute: 0.8 10*3/uL (ref 0.1–1.0)
Monocytes Relative: 8 % (ref 3–12)
Neutro Abs: 6.5 10*3/uL (ref 1.7–7.7)
Neutrophils Relative %: 70 % (ref 43–77)
Platelets: 154 10*3/uL (ref 150–400)
RBC: 3.88 MIL/uL (ref 3.87–5.11)
RDW: 13.1 % (ref 11.5–15.5)
WBC: 9.3 10*3/uL (ref 4.0–10.5)

## 2015-01-30 LAB — COMPREHENSIVE METABOLIC PANEL
ALT: 24 U/L (ref 14–54)
AST: 28 U/L (ref 15–41)
Albumin: 3.6 g/dL (ref 3.5–5.0)
Alkaline Phosphatase: 90 U/L (ref 38–126)
Anion gap: 10 (ref 5–15)
BUN: 12 mg/dL (ref 6–20)
CO2: 23 mmol/L (ref 22–32)
Calcium: 8.8 mg/dL — ABNORMAL LOW (ref 8.9–10.3)
Chloride: 102 mmol/L (ref 101–111)
Creatinine, Ser: 0.68 mg/dL (ref 0.44–1.00)
GFR calc Af Amer: >60 mL/min (ref >60)
GFR calc non Af Amer: >60 mL/min (ref >60)
Glucose, Bld: 137 mg/dL — ABNORMAL HIGH (ref 65–99)
Potassium: 3.6 mmol/L (ref 3.5–5.1)
Sodium: 135 mmol/L (ref 135–145)
Total Bilirubin: 0.9 mg/dL (ref 0.3–1.2)
Total Protein: 7.2 g/dL (ref 6.5–8.1)

## 2015-01-30 LAB — LIPASE, BLOOD: Lipase: 38 U/L (ref 22–51)

## 2015-01-30 LAB — I-STAT CG4 LACTIC ACID, ED: Lactic Acid, Venous: 0.87 mmol/L (ref 0.5–2.0)

## 2015-01-30 MED ORDER — OXYCODONE HCL 5 MG PO TABS: 5.0000 mg | ORAL_TABLET | ORAL | Status: DC | PRN

## 2015-01-30 MED ORDER — TETANUS-DIPHTH-ACELL PERTUSSIS 5-2.5-18.5 LF-MCG/0.5 IM SUSP
0.5000 mL | Freq: Once | INTRAMUSCULAR | Status: AC
  Administered 2015-01-30: 0.5 mL via INTRAMUSCULAR
  Filled 2015-01-30: qty 0.5

## 2015-01-30 MED ORDER — MORPHINE SULFATE 4 MG/ML IJ SOLN
6.0000 mg | INTRAMUSCULAR | Status: DC | PRN
  Administered 2015-01-30 (×2): 6 mg via INTRAVENOUS
  Filled 2015-01-30 (×2): qty 2

## 2015-01-30 NOTE — ED Notes (Signed)
Ice pack applied to face/eyes. Patient verbalized appreciation.

## 2015-01-30 NOTE — Discharge Instructions (Signed)
You have a left elbow fracture called a radial head fracture.  The two pieces of bone are still in good alignment, so we will attempt to treat you with a short period of immobilization with a sling, followed by early range of motion.  You can wear a sling for comfort but it is very important that you start to work on moving your elbow as soon as possible to ensure that you do not get stiffness long term in that joint.   Please follow up as directed with the ortho clinic for re-evaluation of your elbow.   You are being discharged with a prescription for narcotics.  Please only take as described.  Do not take prior to driving as these medications can make you sleepy.
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
## 2015-01-30 NOTE — ED Notes (Signed)
Patient transported to CT via stretcher.

## 2015-01-30 NOTE — ED Notes (Signed)
Dr.Lockwood at bedside  

## 2015-01-30 NOTE — ED Notes (Signed)
Patient transported to X-ray 
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
## 2015-01-31 NOTE — ED Notes (Signed)
Lenell Antu, MD at bedside with patient and family. Discussing further discharge instructions.

## 2015-01-31 NOTE — ED Notes (Signed)
Sling provided to patient, and applied by EMT, to L arm.

## 2015-02-01 ENCOUNTER — Telehealth: Payer: Self-pay | Admitting: Family Medicine

## 2015-02-01 NOTE — Telephone Encounter (Signed)
Renae Fickle called for his wife.  She fee 2 days ago down steps.  She is on Oxycodone 5 mg q 4 hr for pain.  This is not keeping her pain away.  If she takes it q 2 hrs then it is working.  She has face pain, muscle pain, etc.  She only was given 30 tablets.  Please call pt [phone] and advise what to do.

## 2015-02-02 NOTE — Telephone Encounter (Signed)
Michelle Mcmahon called husband yesterday and advised per me that she can take 2 Oxycodone q4-6 hours for pain for a few days, then back off to 1 tablet q4-6 hrs prn.  Can overlap with ibuprofen OTC q4 hours

## 2015-02-09 ENCOUNTER — Telehealth: Payer: Self-pay | Admitting: Family Medicine

## 2015-02-09 MED ORDER — HYDROCODONE-ACETAMINOPHEN 7.5-325 MG PO TABS: 1.0000 | ORAL_TABLET | Freq: Four times a day (QID) | ORAL | Status: DC | PRN

## 2015-02-09 NOTE — Telephone Encounter (Signed)
Patient called for refill on her oxycodone. I will give her Norco 7.5/325. If this is not helpful she will need to be rechecked.

## 2015-02-09 NOTE — Telephone Encounter (Signed)
Pt called requesting pain med, had been taking oxycodone, Pt uses [facility] . Please call to let her know when it had been sent.[phone]

## 2015-02-09 NOTE — Telephone Encounter (Signed)
Advised pt of Rx for Norco & if no better by Monday need to be reevaluated

## 2015-02-20 ENCOUNTER — Ambulatory Visit
Admission: RE | Admit: 2015-02-20 | Discharge: 2015-02-20 | Disposition: A | Discharge status: Home / Self Care | Payer: 59 | Source: Ambulatory Visit | Attending: Medical | Admitting: Medical

## 2015-02-20 ENCOUNTER — Encounter: Payer: Self-pay | Admitting: Medical

## 2015-02-20 ENCOUNTER — Ambulatory Visit (INDEPENDENT_AMBULATORY_CARE_PROVIDER_SITE_OTHER): Payer: 59 | Admitting: Medical

## 2015-02-20 VITALS — BP 124/82 | HR 76 | Wt 191.6 lb

## 2015-02-20 DIAGNOSIS — S20212D Contusion of left front wall of thorax, subsequent encounter: Secondary | ICD-10-CM

## 2015-02-20 DIAGNOSIS — R51 Headache: Secondary | ICD-10-CM

## 2015-02-20 DIAGNOSIS — M79642 Pain in left hand: Secondary | ICD-10-CM | POA: Diagnosis not present

## 2015-02-20 DIAGNOSIS — R519 Headache, unspecified: Secondary | ICD-10-CM

## 2015-02-20 DIAGNOSIS — S52122S Displaced fracture of head of left radius, sequela: Secondary | ICD-10-CM

## 2015-02-20 DIAGNOSIS — W1809XD Striking against other object with subsequent fall, subsequent encounter: Secondary | ICD-10-CM

## 2015-02-20 DIAGNOSIS — M25512 Pain in left shoulder: Secondary | ICD-10-CM | POA: Diagnosis not present

## 2015-02-20 DIAGNOSIS — S298XXD Other specified injuries of thorax, subsequent encounter: Secondary | ICD-10-CM

## 2015-02-20 MED ORDER — HYDROCODONE-ACETAMINOPHEN 7.5-325 MG PO TABS: 1.0000 | ORAL_TABLET | Freq: Four times a day (QID) | ORAL | Status: DC | PRN
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
## 2015-06-11 ENCOUNTER — Telehealth: Payer: Self-pay

## 2015-06-11 ENCOUNTER — Other Ambulatory Visit: Payer: Self-pay | Admitting: Medical

## 2015-06-11 NOTE — Telephone Encounter (Addendum)
Refill request for Alprazolam 0.5mg  #30 and Phentermine HCL 37.5mg  #30 [facility] [address]

## 2015-06-11 NOTE — Telephone Encounter (Signed)
Call out Xanax (no refill).   I would need to see her back on med check in general, particular phentermine.

## 2015-06-11 NOTE — Telephone Encounter (Signed)
Is this ok to refill?  

## 2015-06-12 ENCOUNTER — Telehealth: Payer: Self-pay

## 2015-06-12 MED ORDER — ALPRAZOLAM 0.5 MG PO TABS: 0.5000 mg | ORAL_TABLET | Freq: Every evening | ORAL | Status: DC | PRN

## 2015-06-12 NOTE — Telephone Encounter (Signed)
Phentermine is only supposed to be used for 2 months max, and alprazolam is controlled substance, both require f/u.   Thus, these medications require f/u, discussion, and avoidance of adverse effects particularly with phentermine.

## 2015-06-12 NOTE — Telephone Encounter (Signed)
Error. Note duplicate

## 2015-06-12 NOTE — Telephone Encounter (Signed)
LMTCB

## 2015-06-12 NOTE — Telephone Encounter (Signed)
[facility] [address] for Alprazolam 0.5mg  #30

## 2015-06-12 NOTE — Telephone Encounter (Signed)
Spoke with pt and she is coming in tomorrow for a followup

## 2015-06-12 NOTE — Telephone Encounter (Signed)
Called in refill on xanax only and pt wants to know why she needs to come back in when you discussed the mediication in the visit in may.

## 2015-06-13 ENCOUNTER — Ambulatory Visit (INDEPENDENT_AMBULATORY_CARE_PROVIDER_SITE_OTHER): Payer: 59 | Admitting: Medical

## 2015-06-13 ENCOUNTER — Encounter: Payer: Self-pay | Admitting: Medical

## 2015-06-13 ENCOUNTER — Telehealth: Payer: Self-pay

## 2015-06-13 VITALS — BP 120/88 | HR 68 | Wt 186.0 lb

## 2015-06-13 DIAGNOSIS — F411 Generalized anxiety disorder: Secondary | ICD-10-CM | POA: Diagnosis not present

## 2015-06-13 DIAGNOSIS — E663 Overweight: Secondary | ICD-10-CM

## 2015-06-13 DIAGNOSIS — R739 Hyperglycemia, unspecified: Secondary | ICD-10-CM | POA: Diagnosis not present

## 2015-06-13 MED ORDER — PHENTERMINE-TOPIRAMATE ER 3.75-23 MG PO CP24: 1.0000 | ORAL_CAPSULE | ORAL | Status: DC

## 2015-06-13 MED ORDER — VENLAFAXINE HCL ER 75 MG PO CP24: 150.0000 mg | ORAL_CAPSULE | Freq: Every day | ORAL | Status: DC

## 2015-06-13 MED ORDER — PHENTERMINE-TOPIRAMATE ER 7.5-46 MG PO CP24: 1.0000 | ORAL_CAPSULE | ORAL | Status: DC

## 2015-06-13 NOTE — Patient Instructions (Signed)
  Thank you for giving me the opportunity to serve you today.    Your diagnosis today includes: Encounter Diagnosis  Name Primary?  Marland Kitchen. Overweight Yes     Specific recommendations today include: Diet  Increase your water intake, get at least 64 ounces of water daily  Eat 3-4 fruits daily  Eat plenty of vegetables throughout the day, preferably each meal  Eat good sources of grains such as oatmeal, barley, whole grain pasta, whole grain bread, but limit the serving size to 1 cup of oatmeal or pasta per meal or 2 slices of bread per meal  We don't need to meat at each meal, however if you do eat meat, limit serving size to the size of your palm, and eat chicken fish or [place]ur[place], lean cuts of meat  Eat beans every day as this is a good nutrient source and helps to curb appetite  Consider using a program such as Weight Watchers  Consider using a Smart phone app such as [employer] or [employer] to track your calories and progress   Things to limit or avoid:  Avoid fast food, fried foods, fatty foods  Limit sweets, ice cream, cake and other baked goods  Avoid soda, beer, alcohol, sweet tea  Exercise  You need to be exercising most days of the week for 30-45 minutes or more  Good forms of exercise include walking, hiking, stationary bike or bicycling outside, lap swimming, aerobics class, dance, Zumba  Consider getting a trainer at a gym to help with exercise  Medication  Begin Qsymia weight loss medication  Start by taking the Qsymia 3.75/23 mg dose, once daily in the morning before breakfast for the first 2 weeks  Then increase to the Qsymia 7.5/46mg  dose, once daily in the morning before breakfast  You will need to use the coupon card to call and activate the medication  If your insurance does not cover the weight loss medicine listed above, check on the insurance coverage for the following medications:  Belviq  Contrave  Saxenda  Consider weighing yourself  daily to keep track of your weight   Follow up 1-2 moths.

## 2015-06-13 NOTE — Progress Notes (Signed)
Subjective: Chief Complaint  Patient presents with  . Follow-up    on phentermine and xanax.    Here for f/u on medications  Weight loss concerns.  Here for concern about weight.   Started Phentermine in October, but doesn't take it daily, kind of used as needed.  Is an emotional eater, stress eater.   We prescribed it in June, but she didn't begin the medication until October.   She is exercising, trying to eat healthy.  She wants to get to 165lb.     Anxiety - lots of stress.  They take care of in laws that live with them, and her son and his 58 year old just moved here, and she is helping take care of them.  The mother of the child is causing problems and she is a heroin addict, adding to stressors.  Uses xanax prn, still takes Effexor regularly.     Objective: BP 120/88 mmHg  Pulse 68  Wt 186 lb (84.369 kg)  General appearance: alert, no distress, WD/WN,  Neck: supple, no lymphadenopathy, no thyromegaly, no masses Heart: RRR, normal S1, S2, no murmurs Lungs: CTA bilaterally, no wheezes, rhonchi, or rales Pulses: 2+ symmetric, upper and lower extremities, normal cap refill Psych: pleasant, good eye contact, answers questions appropriately   Assessment: Encounter Diagnoses  Name Primary?  Marland Kitchen. Anxiety state Yes  . Overweight   . Hyperglycemia     Plan: Anxiety - c/t current medication, discussed stress reduction.   Overweight, hyperglycemia - discussed diet, exercise, goals, water intake, sleep, stress reduction.   Stop phentermine due to limited use and risks.  Begin trial of Qsymia.   discussed risks/benefits of medications.  F/u 1-2 mo

## 2015-06-13 NOTE — Telephone Encounter (Signed)
[facility] that they did not get her refill for xanax that i called in yesterday morning that was left on the voicemail. I called and the pharamcist said she didn't get it which i dont know why but i gave her verbal and shes filling it now

## 2015-07-27 ENCOUNTER — Encounter: Payer: Self-pay | Admitting: Medical

## 2015-08-06 ENCOUNTER — Telehealth: Payer: Self-pay | Admitting: Medical

## 2015-08-06 ENCOUNTER — Other Ambulatory Visit: Payer: Self-pay | Admitting: Medical

## 2015-08-06 MED ORDER — PHENTERMINE HCL 37.5 MG PO TABS: 37.5000 mg | ORAL_TABLET | Freq: Every day | ORAL | Status: DC

## 2015-08-06 NOTE — Telephone Encounter (Signed)
Pt is aware.  

## 2015-08-06 NOTE — Telephone Encounter (Signed)
Go ahead and call out Phentermine for 30 + 1 refill.  Let her know we can try 2 months at a time, then office visit to monitor BP and weight.   The goal is to get to a goal weight and maintain.

## 2015-08-06 NOTE — Telephone Encounter (Signed)
Phoned in medications and lmtcb

## 2015-08-08 ENCOUNTER — Telehealth: Payer: Self-pay | Admitting: Medical

## 2015-08-08 NOTE — Telephone Encounter (Signed)
Left message for pt, need waist circumference and any cardiovascular risk factors

## 2015-08-16 NOTE — Telephone Encounter (Signed)
Left another message for pt with husband (he is not on HIPPA)

## 2015-08-21 NOTE — Telephone Encounter (Signed)
Pt called back & states waist circumference is 42 & post menopausal & no other cardiac risks  Completed P.A. Phentermine & emailed

## 2015-08-23 NOTE — Telephone Encounter (Signed)
P.A. Approved til 11/13/15 pt is limited to 12 weeks of therapy per year, faxed pharmacy, Pt informed

## 2015-10-11 ENCOUNTER — Encounter: Payer: Self-pay | Admitting: Medical

## 2015-10-11 ENCOUNTER — Ambulatory Visit (INDEPENDENT_AMBULATORY_CARE_PROVIDER_SITE_OTHER): Payer: BLUE CROSS/BLUE SHIELD | Admitting: Medical

## 2015-10-11 VITALS — BP 118/70 | HR 79 | Ht 68.5 in | Wt 180.0 lb

## 2015-10-11 DIAGNOSIS — N951 Menopausal and female climacteric states: Secondary | ICD-10-CM | POA: Diagnosis not present

## 2015-10-11 DIAGNOSIS — E663 Overweight: Secondary | ICD-10-CM | POA: Diagnosis not present

## 2015-10-11 DIAGNOSIS — R7301 Impaired fasting glucose: Secondary | ICD-10-CM

## 2015-10-11 DIAGNOSIS — K743 Primary biliary cirrhosis: Secondary | ICD-10-CM | POA: Diagnosis not present

## 2015-10-11 MED ORDER — PHENTERMINE HCL 37.5 MG PO TABS: 37.5000 mg | ORAL_TABLET | Freq: Every day | ORAL | Status: DC

## 2015-10-11 NOTE — Progress Notes (Signed)
Subjective: Chief Complaint  Patient presents with  . Follow-up    phentermine. no problems or complaints   Here for f/u on medications.  Since 06/2015 has used 2 months of phentermine.  Uses this prn and it helps depress appetite which helps her maintain her weight.   Would like to continue prn.  She understands this isn't something she can use daily, and is considered short term.  She is an emotional eater, stress eater.   She is exercising, trying to eat healthy.  She wants to get to 165 lb.   She sees liver specialist soon and has fasting labs planned there No other aggravating or relieving factors. No other complaint.  Past Medical History  Diagnosis Date  . Menopausal state     started Citalopram 2014   . Shoulder bursitis     left, prior steroidal shots [facility]  . 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 H/O mammogram 01/2013    normal  . Routine gynecological examination 04/2013    Dr. Ernestina Penna  . Atrophic vaginitis     04/2013  . Cervical polyp 04/2013    Dr. Ernestina Penna, biopsied   . Abnormal liver function test     consult with Dr. 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 Primary biliary cirrhosis (HCC) 2014    Dr. Elnoria Howard   ROS as in subjective   Objective: BP 118/70 mmHg  Pulse 79  Ht 5' 8.5" (1.74 m)  Wt 180 lb (81.647 kg)  BMI 26.97 kg/m2  Wt Readings from Last 3 Encounters:  10/11/15 180 lb (81.647 kg)  06/13/15 186 lb (84.369 kg)  02/20/15 191 lb 9.6 oz (86.909 kg)   General appearance: alert, no distress, WD/WN,  Neck: supple, no lymphadenopathy, no thyromegaly, no masses Heart: RRR, normal S1, S2, no murmurs Lungs: CTA bilaterally, no wheezes, rhonchi, or rales Pulses: 2+ symmetric, upper and lower extremities, normal cap refill Psych: pleasant, good eye contact, answers questions appropriately    Assessment: Encounter Diagnoses  Name Primary?  Marland Kitchen Overweight Yes  . Impaired fasting blood sugar   . Primary  biliary cirrhosis (HCC)   . Menopausal state      Plan: Overweight, hyperglycemia - c/t prn intermittent use of phentermine.   The other long term weight management medications are too expensive, and this medication phentermine helps keep her appetite in check.  She will c/t to use this prn, not daily and can consider using 1/2 tablet few days per week, or for a period of time when she yo yos.   discussed risks of medications.  discussed diet in detail today, water intake, calorie consistency, using [employer] or other app to track exercise, weight, calories.  F/u in 71mo, sooner prn.  Wrote lab order on script today she will have done with Dr. Elnoria Howard when she has labs in a few weeks.   Michelle Mcmahon was seen today for follow-up.  Diagnoses and all orders for this visit:  Overweight -     Hemoglobin A1c; Future -     Lipid panel; Future  Impaired fasting blood sugar -     Hemoglobin A1c; Future -     Lipid panel; Future  Primary biliary cirrhosis (HCC) -     Hemoglobin A1c; Future -     Lipid panel; Future  Menopausal state -     Hemoglobin A1c; Future -     Lipid panel; Future  Other orders -  phentermine (ADIPEX-P) 37.5 MG tablet; Take 1 tablet (37.5 mg total) by mouth daily before breakfast.

## 2015-10-23 ENCOUNTER — Telehealth: Payer: Self-pay | Admitting: Medical

## 2015-10-23 NOTE — Telephone Encounter (Signed)
I reviewed her recent labs.  cholesterol a little higher than I like, but I recommend she avoid red meat, fried foods, processed foods, and DO EAT things like whole grains, fish, leafy greens, fruits.

## 2015-10-24 NOTE — Telephone Encounter (Signed)
Pt is aware.  

## 2015-11-01 ENCOUNTER — Encounter: Payer: Self-pay | Admitting: Medical

## 2015-11-08 IMAGING — CR DG ELBOW COMPLETE 3+V*L*
4 series · 4 of 4 positions shown · non-contrast
Comparison: Left elbow series of January 30, 2015

CLINICAL DATA: Status post fall on January 30, 2015 with persistent
left elbow pain

EXAM:
LEFT ELBOW - COMPLETE 3+ VIEW

[x elbow ap left]
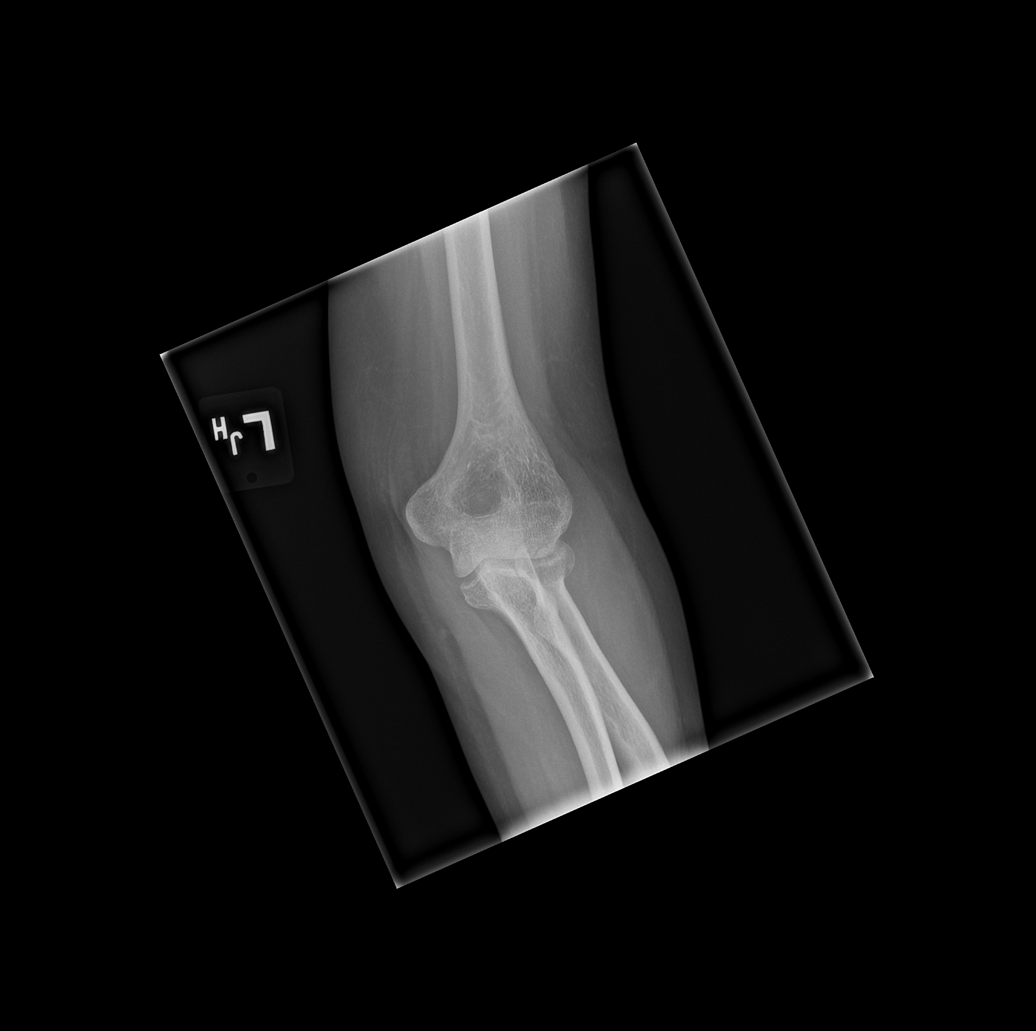

[x elbow obl left (1 of 2)]
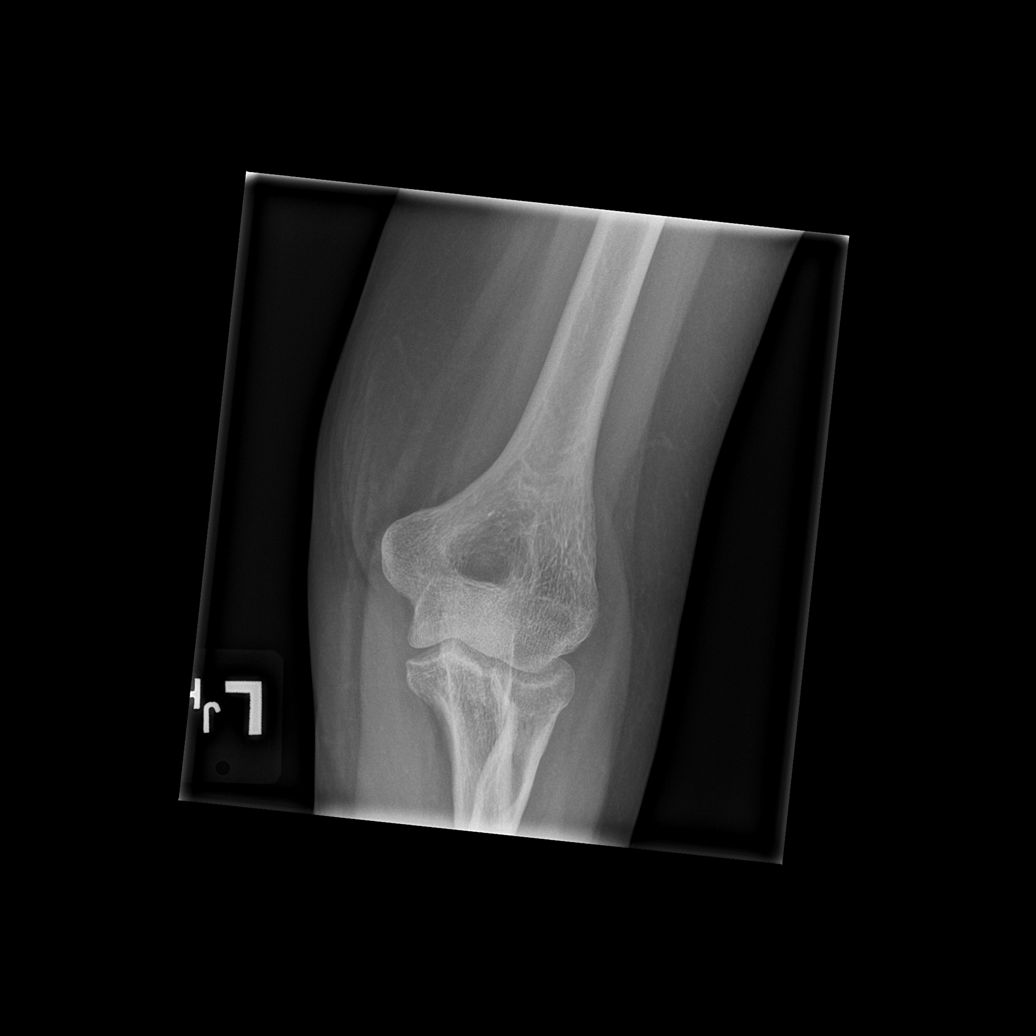

[x elbow obl left (2 of 2)]
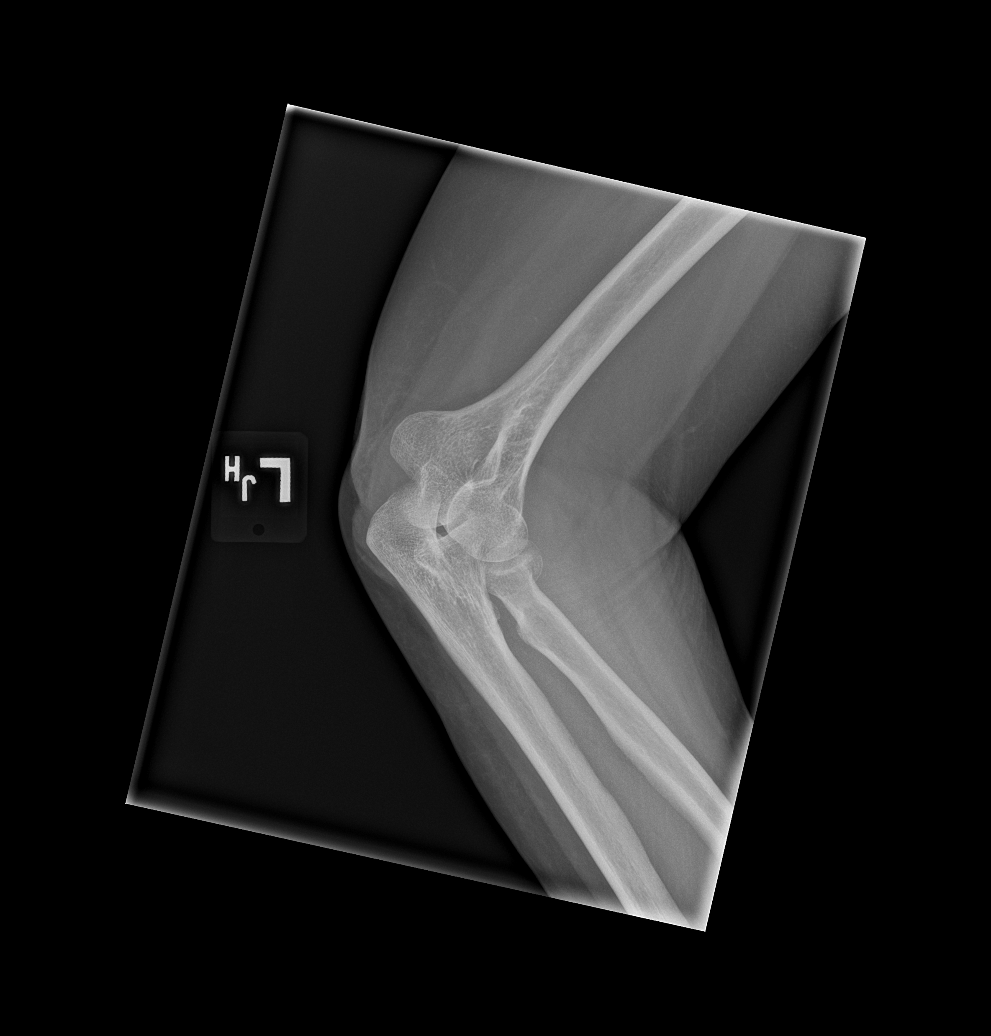

[x elbow lat left]
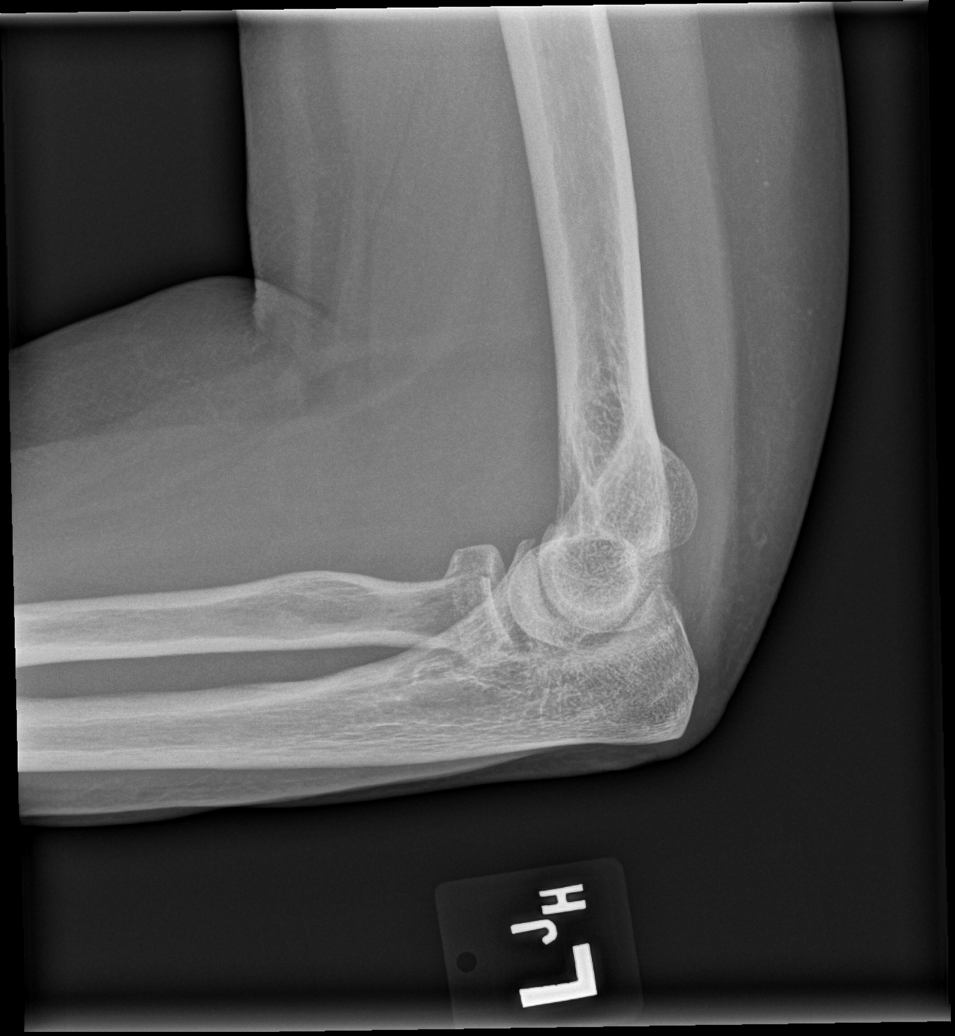

[4 of 4 positions shown; findings below may reference images not displayed]

FINDINGS: The patient has a known impacted intra-articular radial head
fracture. There is no significant callus formation. The adjacent
ulna is intact. The condylar and supracondylar portions of the
humerus are normal.
IMPRESSION: Impacted intra-articular fracture of the radial head without
evidence of significant ongoing healing since the previous study.

## 2016-03-26 ENCOUNTER — Ambulatory Visit (INDEPENDENT_AMBULATORY_CARE_PROVIDER_SITE_OTHER): Payer: BLUE CROSS/BLUE SHIELD | Admitting: Medical

## 2016-03-26 ENCOUNTER — Other Ambulatory Visit: Payer: Self-pay | Admitting: Medical

## 2016-03-26 VITALS — BP 120/82 | HR 68 | Temp 97.8°F | Wt 184.0 lb

## 2016-03-26 DIAGNOSIS — K743 Primary biliary cirrhosis: Secondary | ICD-10-CM | POA: Diagnosis not present

## 2016-03-26 DIAGNOSIS — J0110 Acute frontal sinusitis, unspecified: Secondary | ICD-10-CM | POA: Diagnosis not present

## 2016-03-26 DIAGNOSIS — F39 Unspecified mood [affective] disorder: Secondary | ICD-10-CM

## 2016-03-26 DIAGNOSIS — N951 Menopausal and female climacteric states: Secondary | ICD-10-CM | POA: Diagnosis not present

## 2016-03-26 DIAGNOSIS — Z780 Asymptomatic menopausal state: Secondary | ICD-10-CM

## 2016-03-26 DIAGNOSIS — R454 Irritability and anger: Secondary | ICD-10-CM | POA: Diagnosis not present

## 2016-03-26 DIAGNOSIS — G4700 Insomnia, unspecified: Secondary | ICD-10-CM | POA: Diagnosis not present

## 2016-03-26 DIAGNOSIS — R4586 Emotional lability: Secondary | ICD-10-CM

## 2016-03-26 LAB — CBC
HCT: 42 % (ref 35.0–45.0)
Hemoglobin: 14.4 g/dL (ref 11.7–15.5)
MCH: 32.8 pg (ref 27.0–33.0)
MCHC: 34.3 g/dL (ref 32.0–36.0)
MCV: 95.7 fL (ref 80.0–100.0)
MPV: 12.4 fL (ref 7.5–12.5)
Platelets: 182 10*3/uL (ref 140–400)
RBC: 4.39 MIL/uL (ref 3.80–5.10)
RDW: 13 % (ref 11.0–15.0)
WBC: 5.8 10*3/uL (ref 4.0–10.5)

## 2016-03-26 LAB — TSH: TSH: 1.57 mIU/L

## 2016-03-26 LAB — HEPATIC FUNCTION PANEL
ALT: 23 U/L (ref 6–29)
AST: 33 U/L (ref 10–35)
Albumin: 4.2 g/dL (ref 3.6–5.1)
Alkaline Phosphatase: 101 U/L (ref 33–130)
Bilirubin, Direct: 0.1 mg/dL (ref <=0.2)
Indirect Bilirubin: 0.5 mg/dL (ref 0.2–1.2)
Total Bilirubin: 0.6 mg/dL (ref 0.2–1.2)
Total Protein: 8.4 g/dL — ABNORMAL HIGH (ref 6.1–8.1)

## 2016-03-26 LAB — BASIC METABOLIC PANEL
BUN: 12 mg/dL (ref 7–25)
CO2: 26 mmol/L (ref 20–31)
Calcium: 9.8 mg/dL (ref 8.6–10.4)
Chloride: 101 mmol/L (ref 98–110)
Creat: 0.65 mg/dL (ref 0.50–1.05)
Glucose, Bld: 89 mg/dL (ref 65–99)
Potassium: 4.5 mmol/L (ref 3.5–5.3)
Sodium: 138 mmol/L (ref 135–146)

## 2016-03-26 MED ORDER — AMOXICILLIN 500 MG PO TABS: ORAL_TABLET | ORAL | 0 refills | Status: DC

## 2016-03-26 MED ORDER — CLONIDINE HCL 0.1 MG PO TABS: 0.1000 mg | ORAL_TABLET | Freq: Three times a day (TID) | ORAL | 2 refills | Status: DC

## 2016-03-26 MED ORDER — ALPRAZOLAM 0.5 MG PO TABS: 0.5000 mg | ORAL_TABLET | Freq: Every evening | ORAL | 0 refills | Status: DC | PRN

## 2016-03-26 NOTE — Progress Notes (Signed)
Subjective:  Michelle Mcmahon is a 60 y.o. female who presents for possible sinus infection.    Wants to talk about Effexor.  Takes this for menopause symptoms.  Was on 2 daily but weaned down to 1 due to groggy feeling.   Feels like it impairs her memory, doesn't feel good on this.  Weaned off completely 2 weeks ago.   Lately having lots of hot flashes, worse than ever, having mood swings, cries at drop of a hat, can be irritable, not sleeping well.  Didn't do well on citalopram.   Tried xanax she had from prior visits.  Took 1/2 tablet right before bedtime, saw some improvements with sleep, irritability.   Trying this several nights in a row seemed to be helpful. Takes care of her young grandson and [place].   Here for sinus infection.  Has sinus pressure, stuffy, achy face, no sneezing, no itchy eyes, .  No prior allergy, headache, some sore throat, no ear pressure, has some drainage.  Been going on almost 2 weeks.  Easily gets sick when around other sick contacts.   Nonsmoker  No other aggravating or relieving factors.  No other c/o.  Past Medical History:  Diagnosis Date  . Abnormal liver function test    consult with Dr. 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 Atrophic vaginitis    04/2013  . Cervical polyp 04/2013   Dr. Ernestina PennaFogleman, biopsied   . H/O mammogram 01/2013   normal  . Menopausal state    started Citalopram 2014   . Primary biliary cirrhosis (HCC) 2014   Dr. Elnoria HowardHung  . Routine gynecological examination 04/2013   Dr. Ernestina PennaFogleman  . Shoulder bursitis    left, prior steroidal shots at [facility]gent [facility]   Current Outpatient Prescriptions on File Prior to Visit  Medication Sig Dispense Refill  . ibuprofen (ADVIL,MOTRIN) 200 MG tablet Take 400 mg by mouth daily as needed (pain). Reported on 10/11/2015    . Multiple Vitamin (MULTIVITAMIN WITH MINERALS) TABS tablet Take 1 tablet by mouth at bedtime.    .  phentermine (ADIPEX-P) 37.5 MG tablet Take 1 tablet (37.5 mg total) by mouth daily before breakfast. 30 tablet 1  . ursodiol (ACTIGALL) 300 MG capsule Take 600 mg by mouth 2 (two) times daily.      No current facility-administered medications on file prior to visit.    Family History  Problem Relation Age of Onset  . Other Mother     accidental death  . Cancer Father     died of colon cancer age 59yo  . Heart disease Father     palpitations  . Colon cancer Father [id number]  . Cancer Brother     bone  . Diabetes Maternal Grandmother   . Heart disease Maternal Grandmother   . Stroke Neg Hx   . Hypertension Neg Hx      ROS as in subjective   Objective: BP 120/82   Pulse 68   Temp 97.8 F (36.6 C) (Oral)   Wt 184 lb (83.5 kg)   BMI 27.57 kg/m   General appearance: Alert, WD/WN, no distress                             Skin: warm, no rash  Head: + frontal and maxillary sinus tenderness,                            Eyes: conjunctiva normal, corneas clear, PERRLA                            Ears: pearly TMs, external ear canals normal                          Nose: septum midline, turbinates swollen, with erythema and clear discharge             Mouth/throat: MMM, tongue normal, mild pharyngeal erythema                           Neck: supple, no adenopathy, no thyromegaly, non tender                          Heart: RRR, normal S1, S2, no murmurs                         Lungs: CTA bilaterally, no wheezes, rales, or rhonchi   Psych: pleasant, good eye contact   Assessment  Encounter Diagnoses  Name Primary?  . Menopause Yes  . Hot flash, menopausal   . Mood swing (HCC)   . Irritability   . Insomnia   . Acute frontal sinusitis, recurrence not specified   . Primary biliary cirrhosis (HCC)       Plan: Menopausal symptoms, hot flashes, mood swings, irritability, insomnia - advised she f/u with gyn, consider HRT and other options.  Begin trial of QHS  clonidine, xanax for prn use.  Failed Effexor and citalopram.  Has tried OTC black cohosh.  discussed diet, execise, water intake, sleep hygiene  Sinusitis - begin amoxicillin, begin OTC Claritin or zyrtec the next several weeks given likely underlying allergies rhinitis without typical symptoms  Biliary cirrhosis - labs today, f/u soon with GI  F/u soon with gyn  Michelle Mcmahon was seen today for consult.  Diagnoses and all orders for this visit:  Menopause -     amoxicillin (AMOXIL) 500 MG tablet; 2 tablets po BID x 10 days -     ALPRAZolam (XANAX) 0.5 MG tablet; Take 1 tablet (0.5 mg total) by mouth at bedtime as needed. -     cloNIDine (CATAPRES) 0.1 MG tablet; Take 1 tablet (0.1 mg total) by mouth 3 (three) times daily. -     CBC -     TSH -     Basic metabolic panel -     Hepatic function panel  Hot flash, menopausal -     amoxicillin (AMOXIL) 500 MG tablet; 2 tablets po BID x 10 days -     ALPRAZolam (XANAX) 0.5 MG tablet; Take 1 tablet (0.5 mg total) by mouth at bedtime as needed. -     cloNIDine (CATAPRES) 0.1 MG tablet; Take 1 tablet (0.1 mg total) by mouth 3 (three) times daily. -     CBC -     TSH -     Basic metabolic panel -     Hepatic function panel  Mood swing (HCC) -     amoxicillin (AMOXIL) 500 MG tablet; 2 tablets po BID x 10 days -     ALPRAZolam (  XANAX) 0.5 MG tablet; Take 1 tablet (0.5 mg total) by mouth at bedtime as needed. -     cloNIDine (CATAPRES) 0.1 MG tablet; Take 1 tablet (0.1 mg total) by mouth 3 (three) times daily.  Irritability -     amoxicillin (AMOXIL) 500 MG tablet; 2 tablets po BID x 10 days -     ALPRAZolam (XANAX) 0.5 MG tablet; Take 1 tablet (0.5 mg total) by mouth at bedtime as needed. -     cloNIDine (CATAPRES) 0.1 MG tablet; Take 1 tablet (0.1 mg total) by mouth 3 (three) times daily.  Insomnia -     amoxicillin (AMOXIL) 500 MG tablet; 2 tablets po BID x 10 days -     ALPRAZolam (XANAX) 0.5 MG tablet; Take 1 tablet (0.5 mg total) by mouth  at bedtime as needed. -     cloNIDine (CATAPRES) 0.1 MG tablet; Take 1 tablet (0.1 mg total) by mouth 3 (three) times daily.  Acute frontal sinusitis, recurrence not specified -     amoxicillin (AMOXIL) 500 MG tablet; 2 tablets po BID x 10 days  Primary biliary cirrhosis (HCC) -     Hepatic function panel

## 2016-03-31 ENCOUNTER — Telehealth: Payer: Self-pay | Admitting: Family Medicine

## 2016-03-31 NOTE — Telephone Encounter (Signed)
Pt called for lab results of protein electrophoresis.  Sandy called lab and was told we should have results on Wed or Thurs of this week.  I called pt back and advised.

## 2016-04-02 LAB — PROTEIN ELECTROPHORESIS, SERUM
Abnormal Protein Band2: NOT DETECTED g/dL
Abnormal Protein Band3: NOT DETECTED g/dL
Albumin ELP: 4.2 g/dL (ref 3.8–4.8)
Alpha-1-Globulin: 0.5 g/dL — ABNORMAL HIGH (ref 0.2–0.3)
Alpha-2-Globulin: 0.7 g/dL (ref 0.5–0.9)
Beta 2: 0.4 g/dL (ref 0.2–0.5)
Beta Globulin: 0.6 g/dL (ref 0.4–0.6)
Gamma Globulin: 2.3 g/dL — ABNORMAL HIGH (ref 0.8–1.7)
Total Protein, Serum Electrophoresis: 8.6 g/dL — ABNORMAL HIGH (ref 6.1–8.1)

## 2016-04-03 ENCOUNTER — Telehealth: Payer: Self-pay

## 2016-04-03 NOTE — Telephone Encounter (Signed)
-----   Message from Jac Canavanavid S Tysinger, PA-C sent at 04/03/2016  6:58 AM EDT ----- Forward copy of labs to Dr. Elnoria HowardHung as well as my recent OV note.   See if she has any improvement with the clonidine.

## 2016-04-03 NOTE — Telephone Encounter (Signed)
Done

## 2016-04-04 ENCOUNTER — Telehealth: Payer: Self-pay

## 2016-04-04 NOTE — Telephone Encounter (Signed)
Spoke to patient. Gave recommendations. Patient verbalized understanding. Says she has talked to spouse about hormone therapy and does not agree to try b/c of heart issues risks. Advised would let PA-Shane know.  Patient agreed.

## 2016-04-04 NOTE — Telephone Encounter (Signed)
-----   Message from Jac Canavanavid S Tysinger, PA-C sent at 04/04/2016  8:06 AM EDT ----- I didn't have Clonidine listed as an allergy or intolerance.  I apologize if she has not done well on this prior.   She is talking about Clonidine, not clonazepam or Klonopin correct?  If she meant one of the others, then clonidine just once daily QHS would be a reasonable step for sleep/hot flashes.  In that case I would recommend she see her gynecologist about possibly using hormone therapy such as patch.

## 2016-04-15 ENCOUNTER — Encounter: Payer: Self-pay | Admitting: *Deleted

## 2016-04-15 NOTE — Telephone Encounter (Signed)
Let her know for now she can use Xanax prn.  If she feels she needs to try something else due to mood hot flashes, sleep issues, there are other things we can try.  There are sleep aids, other medications a little different than Effexor that are non hormonal.   I'll leave it up to her.  I understand the concern for maybe avoiding hormone therapy

## 2016-04-15 NOTE — Telephone Encounter (Signed)
Sent mychart message

## 2016-04-18 ENCOUNTER — Encounter: Payer: Self-pay | Admitting: Medical

## 2016-04-22 ENCOUNTER — Other Ambulatory Visit: Payer: Self-pay | Admitting: Medical

## 2016-04-22 MED ORDER — PAROXETINE HCL 10 MG PO TABS: 10.0000 mg | ORAL_TABLET | Freq: Every day | ORAL | 1 refills | Status: DC

## 2016-04-25 ENCOUNTER — Other Ambulatory Visit: Payer: Self-pay | Admitting: Rheumatology

## 2016-04-25 ENCOUNTER — Ambulatory Visit (INDEPENDENT_AMBULATORY_CARE_PROVIDER_SITE_OTHER): Payer: BLUE CROSS/BLUE SHIELD | Admitting: Rheumatology

## 2016-04-25 DIAGNOSIS — M79642 Pain in left hand: Secondary | ICD-10-CM

## 2016-04-25 DIAGNOSIS — M79671 Pain in right foot: Secondary | ICD-10-CM

## 2016-04-25 DIAGNOSIS — M25562 Pain in left knee: Secondary | ICD-10-CM | POA: Diagnosis not present

## 2016-04-25 DIAGNOSIS — M25561 Pain in right knee: Secondary | ICD-10-CM

## 2016-04-25 DIAGNOSIS — M79672 Pain in left foot: Secondary | ICD-10-CM

## 2016-04-25 DIAGNOSIS — M7071 Other bursitis of hip, right hip: Secondary | ICD-10-CM | POA: Diagnosis not present

## 2016-04-25 DIAGNOSIS — M791 Myalgia: Secondary | ICD-10-CM

## 2016-04-25 DIAGNOSIS — M79641 Pain in right hand: Secondary | ICD-10-CM | POA: Diagnosis not present

## 2016-04-25 DIAGNOSIS — G4709 Other insomnia: Secondary | ICD-10-CM | POA: Diagnosis not present

## 2016-04-25 LAB — URIC ACID: Uric Acid, Serum: 5.4 mg/dL (ref 2.5–7.0)

## 2016-04-25 LAB — TSH: TSH: 0.86 mIU/L

## 2016-04-25 LAB — RHEUMATOID FACTOR: Rhuematoid fact SerPl-aCnc: <14 IU/mL (ref <14)

## 2016-04-25 LAB — CK: Total CK: 241 U/L — ABNORMAL HIGH (ref 7–177)

## 2016-04-26 LAB — VITAMIN D 25 HYDROXY (VIT D DEFICIENCY, FRACTURES): Vit D, 25-Hydroxy: 24 ng/mL — ABNORMAL LOW (ref 30–100)

## 2016-04-26 LAB — SEDIMENTATION RATE: Sed Rate: 38 mm/hr — ABNORMAL HIGH (ref 0–30)

## 2016-04-28 LAB — CYCLIC CITRUL PEPTIDE ANTIBODY, IGG: Cyclic Citrullin Peptide Ab: <16 Units

## 2016-04-29 LAB — ANA: Anti Nuclear Antibody(ANA): NEGATIVE

## 2016-04-30 LAB — HM MAMMOGRAPHY

## 2016-04-30 LAB — HM PAP SMEAR: HM Pap smear: NEGATIVE

## 2016-05-05 LAB — PROTEIN ELECTROPHORESIS, SERUM, WITH REFLEX
Albumin ELP: 4.2 g/dL (ref 3.8–4.8)
Alpha-1-Globulin: 0.3 g/dL (ref 0.2–0.3)
Alpha-2-Globulin: 0.7 g/dL (ref 0.5–0.9)
Beta 2: 0.4 g/dL (ref 0.2–0.5)
Beta Globulin: 0.5 g/dL (ref 0.4–0.6)
Gamma Globulin: 2.2 g/dL — ABNORMAL HIGH (ref 0.8–1.7)
Total Protein, Serum Electrophoresis: 8.3 g/dL — ABNORMAL HIGH (ref 6.1–8.1)

## 2016-05-05 LAB — IFE INTERPRETATION

## 2016-05-06 ENCOUNTER — Encounter: Payer: Self-pay | Admitting: *Deleted

## 2016-05-06 DIAGNOSIS — I499 Cardiac arrhythmia, unspecified: Secondary | ICD-10-CM

## 2016-05-06 DIAGNOSIS — E559 Vitamin D deficiency, unspecified: Secondary | ICD-10-CM | POA: Insufficient documentation

## 2016-05-06 DIAGNOSIS — I8390 Asymptomatic varicose veins of unspecified lower extremity: Secondary | ICD-10-CM

## 2016-05-06 DIAGNOSIS — K649 Unspecified hemorrhoids: Secondary | ICD-10-CM

## 2016-05-06 DIAGNOSIS — M7918 Myalgia, other site: Secondary | ICD-10-CM | POA: Insufficient documentation

## 2016-05-06 DIAGNOSIS — M19072 Primary osteoarthritis, left ankle and foot: Secondary | ICD-10-CM | POA: Insufficient documentation

## 2016-05-06 DIAGNOSIS — M19042 Primary osteoarthritis, left hand: Secondary | ICD-10-CM | POA: Insufficient documentation

## 2016-05-06 DIAGNOSIS — M19071 Primary osteoarthritis, right ankle and foot: Secondary | ICD-10-CM | POA: Insufficient documentation

## 2016-05-06 DIAGNOSIS — M19041 Primary osteoarthritis, right hand: Secondary | ICD-10-CM | POA: Insufficient documentation

## 2016-05-06 DIAGNOSIS — E785 Hyperlipidemia, unspecified: Secondary | ICD-10-CM

## 2016-05-06 DIAGNOSIS — F419 Anxiety disorder, unspecified: Secondary | ICD-10-CM | POA: Insufficient documentation

## 2016-05-06 HISTORY — DX: Unspecified hemorrhoids: K64.9

## 2016-05-06 HISTORY — DX: Cardiac arrhythmia, unspecified: I49.9

## 2016-05-06 HISTORY — DX: Asymptomatic varicose veins of unspecified lower extremity: I83.90

## 2016-05-06 HISTORY — DX: Anxiety disorder, unspecified: F41.9

## 2016-05-06 HISTORY — DX: Hyperlipidemia, unspecified: E78.5

## 2016-05-06 NOTE — Progress Notes (Signed)
*  IMAGE* Office Visit Note  Patient: Michelle Mcmahon             Date of Birth: Jan 25, 1956           MRN: [id number]             PCP: Crisoforo Oxford, PA-C Referring:Hung,PatrickMD Visit Date: 05/07/2016 Occupation: Retired    Subjective:  Pain and bilateral hands  History of Present Illness: Michelle Mcmahon is a 60 y.o. female with history of multiple arthralgias. She came today to get ultrasound examination of bilateral hands.  No Rheumatology ROS completed.   Objective: Physical Exam - not performed  CDAI Exam: No CDAI exam completed.    Investigation: Findings:  04/25/2016 ESR 38, uric acid 5.4, CK 241, TSH 0.86, SPEP negative RF-,CCP-,ANA-, vitamin D 30 March 2016 CBC normal CMP normal 04/25/2016 x-ray bilateral hands showed mild osteoarthritis, bilateral feet showed mild OA, bilateral knee joints showed moderate osteoarthritis and moderate chondromalacia patella    Imaging: [place] Extrem Up Bilat Comp  Result Date: 05/07/2016 Ultrasound examination of bilateral hands was performed regular recommendation. Using 12 MHz transfuser grayscale and power Doppler bilateral second third and fifth MCP joints and bilateral wrist joints both dorsal and lower aspects were evaluated There were no synovitis or tenosynovitis on examination. Her right median nerve was 0.09 cm squares and left median nerve was 0.10 cm squares which were within normal limits. Impression.: There was no evidence of synovitis or tenosynovitis on examination.   Speciality Comments: No specialty comments available.    Procedures:  No procedures performed Allergies: Citalopram and Effexor [venlafaxine]   Assessment / Plan: Visit Diagnoses: Primary osteoarthritis of both hands: Ultrasound examination is negative for any synovitis. Her labs were remarkable for mild elevation of sedimentation rate. She also has mild elevation of CK which was discussed. Vitamin D deficiency: She was given vitamin D 50,000  units once a week for 3 months.  I will check her vitamin D level, CK and sedimentation rate in 3 months. I'll see her back in 3 months.     Orders: Orders Placed This Encounter  Procedures  . [place] Extrem Up Bilat Comp  . VITAMIN D 25 Hydroxy (Vit-D Deficiency, Fractures)  . Sedimentation rate  . CK   No orders of the defined types were placed in this encounter.  Follow-Up Instructions: Return in about 3 months (around 08/07/2016) for Osteoarthritis.   Bo Merino, MD

## 2016-05-07 ENCOUNTER — Other Ambulatory Visit: Payer: Self-pay | Admitting: Radiology

## 2016-05-07 ENCOUNTER — Encounter: Payer: Self-pay | Admitting: Medical

## 2016-05-07 ENCOUNTER — Inpatient Hospital Stay (INDEPENDENT_AMBULATORY_CARE_PROVIDER_SITE_OTHER): Payer: Self-pay

## 2016-05-07 ENCOUNTER — Other Ambulatory Visit: Payer: Self-pay | Admitting: Medical

## 2016-05-07 ENCOUNTER — Ambulatory Visit (INDEPENDENT_AMBULATORY_CARE_PROVIDER_SITE_OTHER): Payer: BLUE CROSS/BLUE SHIELD | Admitting: Rheumatology

## 2016-05-07 DIAGNOSIS — M791 Myalgia: Secondary | ICD-10-CM

## 2016-05-07 DIAGNOSIS — M19041 Primary osteoarthritis, right hand: Secondary | ICD-10-CM

## 2016-05-07 DIAGNOSIS — M7918 Myalgia, other site: Secondary | ICD-10-CM

## 2016-05-07 DIAGNOSIS — M79641 Pain in right hand: Secondary | ICD-10-CM

## 2016-05-07 DIAGNOSIS — M79642 Pain in left hand: Secondary | ICD-10-CM

## 2016-05-07 DIAGNOSIS — M2550 Pain in unspecified joint: Secondary | ICD-10-CM

## 2016-05-07 DIAGNOSIS — E559 Vitamin D deficiency, unspecified: Secondary | ICD-10-CM

## 2016-05-07 DIAGNOSIS — M7062 Trochanteric bursitis, left hip: Secondary | ICD-10-CM

## 2016-05-07 DIAGNOSIS — M2240 Chondromalacia patellae, unspecified knee: Secondary | ICD-10-CM

## 2016-05-07 DIAGNOSIS — R748 Abnormal levels of other serum enzymes: Secondary | ICD-10-CM

## 2016-05-07 DIAGNOSIS — M19071 Primary osteoarthritis, right ankle and foot: Secondary | ICD-10-CM

## 2016-05-07 DIAGNOSIS — M19042 Primary osteoarthritis, left hand: Secondary | ICD-10-CM | POA: Diagnosis not present

## 2016-05-07 DIAGNOSIS — M7061 Trochanteric bursitis, right hip: Secondary | ICD-10-CM

## 2016-05-07 DIAGNOSIS — K743 Primary biliary cirrhosis: Secondary | ICD-10-CM

## 2016-05-07 DIAGNOSIS — M170 Bilateral primary osteoarthritis of knee: Secondary | ICD-10-CM

## 2016-05-07 DIAGNOSIS — M19072 Primary osteoarthritis, left ankle and foot: Secondary | ICD-10-CM

## 2016-05-07 MED ORDER — VITAMIN D3 1.25 MG (50000 UT) PO CAPS: 50000.0000 [IU] | ORAL_CAPSULE | ORAL | 0 refills | Status: AC

## 2016-05-07 MED ORDER — PAROXETINE HCL 20 MG PO TABS: 20.0000 mg | ORAL_TABLET | Freq: Every day | ORAL | 1 refills | Status: DC

## 2016-05-07 NOTE — Patient Instructions (Signed)
Take Vitamin D once a week Labs for repeat Vitamin D level, CK and Sed rate are expected in Feb

## 2016-05-27 ENCOUNTER — Ambulatory Visit: Payer: BLUE CROSS/BLUE SHIELD | Admitting: Rheumatology

## 2016-06-26 ENCOUNTER — Encounter: Payer: Self-pay | Admitting: Internal Medicine

## 2016-07-08 ENCOUNTER — Encounter: Payer: Self-pay | Admitting: Medical

## 2016-07-08 ENCOUNTER — Other Ambulatory Visit: Payer: Self-pay | Admitting: Medical

## 2016-07-08 MED ORDER — PAROXETINE HCL 40 MG PO TABS: 40.0000 mg | ORAL_TABLET | ORAL | 0 refills | Status: DC

## 2016-07-24 ENCOUNTER — Encounter: Payer: Self-pay | Admitting: Medical

## 2016-07-25 ENCOUNTER — Other Ambulatory Visit: Payer: Self-pay | Admitting: Medical

## 2016-07-25 ENCOUNTER — Telehealth: Payer: Self-pay | Admitting: Medical

## 2016-07-25 NOTE — Telephone Encounter (Signed)
Get her in for medication f/u since she is having weight gain on Paxil.

## 2016-07-26 ENCOUNTER — Encounter: Payer: Self-pay | Admitting: Medical

## 2016-07-28 NOTE — Telephone Encounter (Signed)
Made appt for Tuesday 07/29/2016 at 10.15

## 2016-07-29 ENCOUNTER — Ambulatory Visit (INDEPENDENT_AMBULATORY_CARE_PROVIDER_SITE_OTHER): Payer: BLUE CROSS/BLUE SHIELD | Admitting: Medical

## 2016-07-29 ENCOUNTER — Encounter: Payer: Self-pay | Admitting: Medical

## 2016-07-29 VITALS — BP 128/84 | HR 73 | Wt 198.0 lb

## 2016-07-29 DIAGNOSIS — N951 Menopausal and female climacteric states: Secondary | ICD-10-CM | POA: Diagnosis not present

## 2016-07-29 DIAGNOSIS — R232 Flushing: Secondary | ICD-10-CM | POA: Diagnosis not present

## 2016-07-29 DIAGNOSIS — R4589 Other symptoms and signs involving emotional state: Secondary | ICD-10-CM

## 2016-07-29 DIAGNOSIS — F329 Major depressive disorder, single episode, unspecified: Secondary | ICD-10-CM | POA: Diagnosis not present

## 2016-07-29 DIAGNOSIS — R635 Abnormal weight gain: Secondary | ICD-10-CM

## 2016-07-29 DIAGNOSIS — F419 Anxiety disorder, unspecified: Secondary | ICD-10-CM

## 2016-07-29 MED ORDER — PAROXETINE HCL 20 MG PO TABS: 20.0000 mg | ORAL_TABLET | Freq: Every day | ORAL | 1 refills | Status: DC

## 2016-07-29 MED ORDER — BUPROPION HCL ER (XL) 150 MG PO TB24: 150.0000 mg | ORAL_TABLET | Freq: Every day | ORAL | 1 refills | Status: DC

## 2016-07-29 MED ORDER — PHENTERMINE HCL 37.5 MG PO TABS: 37.5000 mg | ORAL_TABLET | Freq: Every day | ORAL | 1 refills | Status: DC

## 2016-07-29 NOTE — Progress Notes (Signed)
Subjective:  Michelle Mcmahon is a 61 y.o. female who presents for medication concerns.  Since last visit has been on Paxil about 4 months.   Started at 10mg , went to 20mg , and now is at 40mg  daily.  thinks this does well with mood.   This dose helps hot flashes, anxiety, depression.   But having big concerns about weight gain, much reduced libido.    Still watches her 2yo grandson from 7am - 6:30pm, takes care of her 41 yo mother in law part of the day, very long days, busy.  So not a lot of free time.   Is careful with diet, getting some exercise.    Prior medications included Effexor that impaired her memory and focus, made her feel groggy, didn't tolerate citalopram.     No other aggravating or relieving factors.  No other c/o.  Past Medical History:  Diagnosis Date  . Abnormal liver function test    consult with Dr. 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 Anxiety 05/06/2016  . Arrhythmia 05/06/2016   During Child Birth Only  . Atrophic vaginitis    04/2013  . Cervical polyp 04/2013   Dr. Ernestina Mcmahon, biopsied   . H/O mammogram 01/2013   normal  . Hemorrhoids 05/06/2016  . Hyperlipidemia 05/06/2016  . Menopausal state    started Citalopram 2014   . Primary biliary cirrhosis 2014   Dr. Elnoria Mcmahon  . Routine gynecological examination 04/2013   Dr. Ernestina Mcmahon  . Shoulder bursitis    left, prior steroidal shots at Urgent Care  . 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 Cholecalciferol (VITAMIN D3) 50000 units CAPS Take 50,000 Units by mouth once a week. 12 capsule 0  . ibuprofen (ADVIL,MOTRIN) 200 MG tablet Take 400 mg by mouth daily as needed (pain). Reported on 10/11/2015    . Multiple Vitamin (MULTIVITAMIN WITH MINERALS) TABS tablet Take 1 tablet by mouth at bedtime.    Marland Kitchen PARoxetine (PAXIL) 40 MG tablet Take 1 tablet (40 mg total) by  mouth every morning. 90 tablet 0  . ursodiol (ACTIGALL) 300 MG capsule Take 600 mg by mouth 2 (two) times daily.      No current facility-administered medications on file prior to visit.    Family History  Problem Relation Age of Onset  . Other Mother     accidental death  . Cancer Father     died of colon cancer age 54yo  . Heart disease Father     palpitations  . Colon cancer Father 68  . Cancer Brother     bone  . Diabetes Maternal Grandmother   . Heart disease Maternal Grandmother   . Stroke Neg Hx   . Hypertension Neg Hx      ROS as in subjective   Objective: BP 128/84   Pulse 73   Wt 198 lb (89.8 kg)   SpO2 99%   BMI 30.11 kg/m   Wt Readings from Last 3 Encounters:  07/29/16 198 lb (89.8 kg)  04/25/16 188 lb (85.3 kg)  03/26/16 184 lb (83.5 kg)   General appearance: Alert, WD/WN, no distress Psych: pleasant, good eye contact     Assessment  Encounter Diagnoses  Name Primary?  . Menopausal state Yes  . Anxiety   . Hot flashes   . Depressed mood   . Weight gain  Plan: Menopausal symptoms, hot flashes, depression and anxiety - doing fine on paxil except weight gain.   Will make the following changes today  Patient Instructions  Recommendations  Wean down from Paxil 40mg , to Paxil 20mg  as follows:  Go ahead and start using 1/2 tablet of Paxil 40mg  daily.   I'll send 20mg  Paxil to pharmacy for when ou run out.  Begin Wellbutrin 150mg  daily along with Paxil.    I don't expect major issues with this, but if you start feeling mood swing, worse hot fflashes or other ssymptoms we may need to use the wWellbutrinevery other day for first week or 2.    But , if no side effects, just begin Wellbutrin daily and Paxil 20mg  daily  Continue working on healthy diet, exercise   can still use phentermine periodic use to help with weight loss, appetite.  Biliary cirrhosis - reviewed 04/2016 GI notes and labs  Michelle Mcmahon was seen today for follow up from  new meds.  Diagnoses and all orders for this visit:  Menopausal state  Anxiety  Hot flashes  Depressed mood  Weight gain  Other orders -     phentermine (ADIPEX-P) 37.5 MG tablet; Take 1 tablet (37.5 mg total) by mouth daily before breakfast. -     PARoxetine (PAXIL) 20 MG tablet; Take 1 tablet (20 mg total) by mouth daily. -     buPROPion (WELLBUTRIN XL) 150 MG 24 hr tablet; Take 1 tablet (150 mg total) by mouth daily.  f/u 4-6 wk for labs to recheck liver

## 2016-07-29 NOTE — Patient Instructions (Signed)
Recommendations  Wean down from Paxil 40mg , to Paxil 20mg  as follows:  Go ahead and start using 1/2 tablet of Paxil 40mg  daily.   I'll send 20mg  Paxil to pharmacy for when ou run out.  Begin Wellbutrin 150mg  daily along with Paxil.    I don't expect major issues with this, but if you start feeling mood swing, worse hot fflashes or other ssymptoms we may need to use the wWellbutrinevery other day for first week or 2.    But , if no side effects, just begin Wellbutrin daily and Paxil 20mg  daily  Continue working on healthy diet, exercise

## 2016-08-07 ENCOUNTER — Ambulatory Visit: Payer: BLUE CROSS/BLUE SHIELD | Admitting: Rheumatology

## 2016-08-21 ENCOUNTER — Encounter: Payer: Self-pay | Admitting: Medical

## 2016-08-21 ENCOUNTER — Other Ambulatory Visit: Payer: Self-pay | Admitting: Medical

## 2016-08-21 MED ORDER — BUPROPION HCL ER (XL) 300 MG PO TB24: 300.0000 mg | ORAL_TABLET | Freq: Every day | ORAL | 1 refills | Status: DC

## 2016-10-30 LAB — HM COLONOSCOPY

## 2016-11-07 ENCOUNTER — Encounter: Payer: Self-pay | Admitting: Medical

## 2017-02-09 ENCOUNTER — Other Ambulatory Visit: Payer: Self-pay | Admitting: Medical

## 2017-02-09 NOTE — Telephone Encounter (Signed)
Can pt have a refill on this 

## 2017-04-15 ENCOUNTER — Ambulatory Visit (INDEPENDENT_AMBULATORY_CARE_PROVIDER_SITE_OTHER): Payer: BLUE CROSS/BLUE SHIELD | Admitting: Rheumatology

## 2017-04-15 ENCOUNTER — Encounter: Payer: Self-pay | Admitting: Rheumatology

## 2017-04-15 VITALS — BP 126/70 | HR 72 | Resp 16 | Ht 68.5 in | Wt 192.0 lb

## 2017-04-15 DIAGNOSIS — M2242 Chondromalacia patellae, left knee: Secondary | ICD-10-CM | POA: Diagnosis not present

## 2017-04-15 DIAGNOSIS — E559 Vitamin D deficiency, unspecified: Secondary | ICD-10-CM

## 2017-04-15 DIAGNOSIS — M19071 Primary osteoarthritis, right ankle and foot: Secondary | ICD-10-CM

## 2017-04-15 DIAGNOSIS — K743 Primary biliary cirrhosis: Secondary | ICD-10-CM | POA: Diagnosis not present

## 2017-04-15 DIAGNOSIS — M19072 Primary osteoarthritis, left ankle and foot: Secondary | ICD-10-CM

## 2017-04-15 DIAGNOSIS — M7062 Trochanteric bursitis, left hip: Secondary | ICD-10-CM | POA: Diagnosis not present

## 2017-04-15 DIAGNOSIS — M19041 Primary osteoarthritis, right hand: Secondary | ICD-10-CM | POA: Diagnosis not present

## 2017-04-15 DIAGNOSIS — M19042 Primary osteoarthritis, left hand: Secondary | ICD-10-CM | POA: Diagnosis not present

## 2017-04-15 DIAGNOSIS — M7918 Myalgia, other site: Secondary | ICD-10-CM

## 2017-04-15 DIAGNOSIS — M2241 Chondromalacia patellae, right knee: Secondary | ICD-10-CM | POA: Diagnosis not present

## 2017-04-15 DIAGNOSIS — M7061 Trochanteric bursitis, right hip: Secondary | ICD-10-CM | POA: Diagnosis not present

## 2017-04-15 DIAGNOSIS — M170 Bilateral primary osteoarthritis of knee: Secondary | ICD-10-CM | POA: Insufficient documentation

## 2017-04-15 NOTE — Patient Instructions (Addendum)
Natural anti-inflammatories  You can purchase these at [employer], [employer] or online.  . Turmeric (capsules)  . Ginger (ginger root or capsules)  . Omega 3 (Fish, flax seeds, chia seeds, walnuts, almonds)  . Tart cherry (dried or extract)   Patient should be under the care of a physician while taking these supplements. This may not be reproduced without the permission of Dr. Lyndee Herbst.  Knee Exercises Ask your health care provider which exercises are safe for you. Do exercises exactly as told by your health care provider and adjust them as directed. It is normal to feel mild stretching, pulling, tightness, or discomfort as you do these exercises, but you should stop right away if you feel sudden pain or your pain gets worse.Do not begin these exercises until told by your health care provider. STRETCHING AND RANGE OF MOTION EXERCISES These exercises warm up your muscles and joints and improve the movement and flexibility of your knee. These exercises also help to relieve pain, numbness, and tingling. Exercise A: Knee Extension, Prone 1. Lie on your abdomen on a bed. 2. Place your left / right knee just beyond the edge of the surface so your knee is not on the bed. You can put a towel under your left / right thigh just above your knee for comfort. 3. Relax your leg muscles and allow gravity to straighten your knee. You should feel a stretch behind your left / right knee. 4. Hold this position for __________ seconds. 5. Scoot up so your knee is supported between repetitions. Repeat __________ times. Complete this stretch __________ times a day. Exercise B: Knee Flexion, Active  1. Lie on your back with both knees straight. If this causes back discomfort, bend your left / right knee so your foot is flat on the floor. 2. Slowly slide your left / right heel back toward your buttocks until you feel a gentle stretch in the front of your knee or thigh. 3. Hold this position for  __________ seconds. 4. Slowly slide your left / right heel back to the starting position. Repeat __________ times. Complete this exercise __________ times a day. Exercise C: Quadriceps, Prone  1. Lie on your abdomen on a firm surface, such as a bed or padded floor. 2. Bend your left / right knee and hold your ankle. If you cannot reach your ankle or pant leg, loop a belt around your foot and grab the belt instead. 3. Gently pull your heel toward your buttocks. Your knee should not slide out to the side. You should feel a stretch in the front of your thigh and knee. 4. Hold this position for __________ seconds. Repeat __________ times. Complete this stretch __________ times a day. Exercise D: Hamstring, Supine 1. Lie on your back. 2. Loop a belt or towel over the ball of your left / right foot. The ball of your foot is on the walking surface, right under your toes. 3. Straighten your left / right knee and slowly pull on the belt to raise your leg until you feel a gentle stretch behind your knee. ? Do not let your left / right knee bend while you do this. ? Keep your other leg flat on the floor. 4. Hold this position for __________ seconds. Repeat __________ times. Complete this stretch __________ times a day. STRENGTHENING EXERCISES These exercises build strength and endurance in your knee. Endurance is the ability to use your muscles for a long time, even after they get tired. Exercise E:   Quadriceps, Isometric  1. Lie on your back with your left / right leg extended and your other knee bent. Put a rolled towel or small pillow under your knee if told by your health care provider. 2. Slowly tense the muscles in the front of your left / right thigh. You should see your kneecap slide up toward your hip or see increased dimpling just above the knee. This motion will push the back of the knee toward the floor. 3. For __________ seconds, keep the muscle as tight as you can without increasing your  pain. 4. Relax the muscles slowly and completely. Repeat __________ times. Complete this exercise __________ times a day. Exercise F: Straight Leg Raises - Quadriceps 1. Lie on your back with your left / right leg extended and your other knee bent. 2. Tense the muscles in the front of your left / right thigh. You should see your kneecap slide up or see increased dimpling just above the knee. Your thigh may even shake a bit. 3. Keep these muscles tight as you raise your leg 4-6 inches (10-15 cm) off the floor. Do not let your knee bend. 4. Hold this position for __________ seconds. 5. Keep these muscles tense as you lower your leg. 6. Relax your muscles slowly and completely after each repetition. Repeat __________ times. Complete this exercise __________ times a day. Exercise G: Hamstring, Isometric 1. Lie on your back on a firm surface. 2. Bend your left / right knee approximately __________ degrees. 3. Dig your left / right heel into the surface as if you are trying to pull it toward your buttocks. Tighten the muscles in the back of your thighs to dig as hard as you can without increasing any pain. 4. Hold this position for __________ seconds. 5. Release the tension gradually and allow your muscles to relax completely for __________ seconds after each repetition. Repeat __________ times. Complete this exercise __________ times a day. Exercise H: Hamstring Curls  If told by your health care provider, do this exercise while wearing ankle weights. Begin with __________ weights. Then increase the weight by 1 lb (0.5 kg) increments. Do not wear ankle weights that are more than __________. 1. Lie on your abdomen with your legs straight. 2. Bend your left / right knee as far as you can without feeling pain. Keep your hips flat against the floor. 3. Hold this position for __________ seconds. 4. Slowly lower your leg to the starting position.  Repeat __________ times. Complete this exercise  __________ times a day. Exercise I: Squats (Quadriceps) 1. Stand in front of a table, with your feet and knees pointing straight ahead. You may rest your hands on the table for balance but not for support. 2. Slowly bend your knees and lower your hips like you are going to sit in a chair. ? Keep your weight over your heels, not over your toes. ? Keep your lower legs upright so they are parallel with the table legs. ? Do not let your hips go lower than your knees. ? Do not bend lower than told by your health care provider. ? If your knee pain increases, do not bend as low. 3. Hold the squat position for __________ seconds. 4. Slowly push with your legs to return to standing. Do not use your hands to pull yourself to standing. Repeat __________ times. Complete this exercise __________ times a day. Exercise J: Wall Slides (Quadriceps)  1. Lean your back against a smooth wall or door while   you walk your feet out 18-24 inches (46-61 cm) from it. 2. Place your feet hip-width apart. 3. Slowly slide down the wall or door until your knees bend __________ degrees. Keep your knees over your heels, not over your toes. Keep your knees in line with your hips. 4. Hold for __________ seconds. Repeat __________ times. Complete this exercise __________ times a day. Exercise K: Straight Leg Raises - Hip Abductors 1. Lie on your side with your left / right leg in the top position. Lie so your head, shoulder, knee, and hip line up. You may bend your bottom knee to help you keep your balance. 2. Roll your hips slightly forward so your hips are stacked directly over each other and your left / right knee is facing forward. 3. Leading with your heel, lift your top leg 4-6 inches (10-15 cm). You should feel the muscles in your outer hip lifting. ? Do not let your foot drift forward. ? Do not let your knee roll toward the ceiling. 4. Hold this position for __________ seconds. 5. Slowly return your leg to the starting  position. 6. Let your muscles relax completely after each repetition. Repeat __________ times. Complete this exercise __________ times a day. Exercise L: Straight Leg Raises - Hip Extensors 1. Lie on your abdomen on a firm surface. You can put a pillow under your hips if that is more comfortable. 2. Tense the muscles in your buttocks and lift your left / right leg about 4-6 inches (10-15 cm). Keep your knee straight as you lift your leg. 3. Hold this position for __________ seconds. 4. Slowly lower your leg to the starting position. 5. Let your leg relax completely after each repetition. Repeat __________ times. Complete this exercise __________ times a day. This information is not intended to replace advice given to you by your health care provider. Make sure you discuss any questions you have with your health care provider. Document Released: 05/07/2005 Document Revised: 03/17/2016 Document Reviewed: 04/29/2015 [employer] Interactive Patient Education  2018 [employer]  Hand Exercises Hand exercises can be helpful to almost anyone. These exercises can strengthen the hands, improve flexibility and movement, and increase blood flow to the hands. These results can make work and daily tasks easier. Hand exercises can be especially helpful for people who have joint pain from arthritis or have nerve damage from overuse (carpal tunnel syndrome). These exercises can also help people who have injured a hand. Most of these hand exercises are fairly gentle stretching routines. You can do them often throughout the day. Still, it is a good idea to ask your health care provider which exercises would be best for you. Warming your hands before exercise may help to reduce stiffness. You can do this with gentle massage or by placing your hands in warm water for 15 minutes. Also, make sure you pay attention to your level of hand pain as you begin an exercise routine. Exercises Knuckle Bend Repeat this exercise  5-10 times with each hand. 6. Stand or sit with your arm, hand, and all five fingers pointed straight up. Make sure your wrist is straight. 7. Gently and slowly bend your fingers down and inward until the tips of your fingers are touching the tops of your palm. 8. Hold this position for a few seconds. 9. Extend your fingers out to their original position, all pointing straight up again.  Finger Fan Repeat this exercise 5-10 times with each hand. 5. Hold your arm and hand out in   front of you. Keep your wrist straight. 6. Squeeze your hand into a fist. 7. Hold this position for a few seconds. 8. Fan out, or spread apart, your hand and fingers as much as possible, stretching every joint fully.  Tabletop Repeat this exercise 5-10 times with each hand. 5. Stand or sit with your arm, hand, and all five fingers pointed straight up. Make sure your wrist is straight. 6. Gently and slowly bend your fingers at the knuckles where they meet the hand until your hand is making an upside-down L shape. Your fingers should form a tabletop. 7. Hold this position for a few seconds. 8. Extend your fingers out to their original position, all pointing straight up again.  Making Os Repeat this exercise 5-10 times with each hand. 1. Stand or sit with your arm, hand, and all five fingers pointed straight up. Make sure your wrist is straight. 2. Make an O shape by touching your pointer finger to your thumb. Hold for a few seconds. Then open your hand wide. 3. Repeat this motion with each finger on your hand.  Table Spread Repeat this exercise 5-10 times with each hand. 5. Place your hand on a table with your palm facing down. Make sure your wrist is straight. 6. Spread your fingers out as much as possible. Hold this position for a few seconds. 7. Slide your fingers back together again. Hold for a few seconds.  Ball Grip  Repeat this exercise 10-15 times with each hand. 7. Hold a tennis ball or another soft  ball in your hand. 8. While slowly increasing pressure, squeeze the ball as hard as possible. 9. Squeeze as hard as you can for 3-5 seconds. 10. Relax and repeat.  Wrist Curls Repeat this exercise 10-15 times with each hand. 6. Sit in a chair that has armrests. 7. Hold a light weight in your hand, such as a dumbbell that weighs 1-3 pounds (0.5-1.4 kg). Ask your health care provider what weight would be best for you. 8. Rest your hand just over the end of the chair arm with your palm facing up. 9. Gently pivot your wrist up and down while holding the weight. Do not twist your wrist from side to side.  Contact a health care provider if:  Your hand pain or discomfort gets much worse when you do an exercise.  Your hand pain or discomfort does not improve within 2 hours after you exercise. If you have any of these problems, stop doing these exercises right away. Do not do them again unless your health care provider says that you can. Get help right away if:  You develop sudden, severe hand pain. If this happens, stop doing these exercises right away. Do not do them again unless your health care provider says that you can. This information is not intended to replace advice given to you by your health care provider. Make sure you discuss any questions you have with your health care provider. Document Released: 06/04/2015 Document Revised: 11/29/2015 Document Reviewed: 01/01/2015 [employer] Interactive Patient Education  2018 [employer]  

## 2017-04-15 NOTE — Progress Notes (Signed)
Office Visit Note  Patient: Michelle Mcmahon             Date of Birth: 01/17/1956           MRN: [id number]             PCP: Jac Canavan, PA-C Referring: Jac Canavan, PA-C Visit Date: 04/15/2017 Occupation: @    Subjective:  Medication Management and Joint Pain (wrist knees ankles painful )   History of Present Illness: Michelle Mcmahon is a 61 y.o. female with history of osteoarthritis and myofascial pain syndrome. She states her symptoms are getting worse with increased pain in her bilateral hands bilateral knees and bilateral feet. She states she uses a low between her knees at night is her knees hurt. She's been also having discomfort in her bilateral trochanteric bursa area. She states she's been taking Tylenol on regular basis to alleviate the pain. She also has history of elevated LFTs for which she's been followed by Dr. Elnoria Howard.  Activities of Daily Living:  Patient reports morning stiffness for 30  minutes.   Patient Reports nocturnal pain.  Difficulty dressing/grooming: Denies Difficulty climbing stairs: Reports Difficulty getting out of chair: Reports Difficulty using hands for taps, buttons, cutlery, and/or writing: Reports   Review of Systems  Constitutional: Negative for fatigue, fever, night sweats, weight gain, weight loss and weakness.  HENT: Negative.  Negative for mouth sores, trouble swallowing, trouble swallowing, mouth dryness and nose dryness.   Eyes: Negative.  Negative for pain, redness, visual disturbance and dryness.  Respiratory: Negative.  Negative for cough, shortness of breath and difficulty breathing.   Cardiovascular: Negative.  Negative for chest pain, palpitations, hypertension, irregular heartbeat and swelling in legs/feet.  Gastrointestinal: Negative.  Negative for blood in stool, constipation and diarrhea.  Endocrine: Negative.  Negative for increased urination.  Genitourinary: Negative.  Negative for vaginal dryness.    Musculoskeletal: Positive for arthralgias, gait problem and joint pain. Negative for joint swelling, myalgias, muscle weakness, morning stiffness, muscle tenderness and myalgias.       Left knee pain  Skin: Negative.  Negative for color change, rash, hair loss, skin tightness, ulcers and sensitivity to sunlight.  Allergic/Immunologic: Negative.  Negative for susceptible to infections.  Neurological: Negative for dizziness, memory loss and night sweats.       Negative   Hematological: Negative for bruising/bleeding tendency and swollen glands.  Psychiatric/Behavioral: Negative.  Negative for depressed mood and sleep disturbance. The patient is not nervous/anxious.     PMFS History:  Patient Active Problem List   Diagnosis Date Noted  . Primary osteoarthritis of both knees 04/15/2017  . 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 Hot flashes 07/29/2016  . Depressed mood 07/29/2016  . Weight gain 07/29/2016  . Anxiety 05/06/2016  . Arrhythmia 05/06/2016  . Varicose vein of leg 05/06/2016  . Hemorrhoids 05/06/2016  . Hyperlipidemia 05/06/2016  . Myofascial pain 05/06/2016  . Osteoarthritis of both hands 05/06/2016  . Osteoarthritis of both feet 05/06/2016  . Vitamin D deficiency 05/06/2016  . Primary biliary cholangitis (HCC) 09/22/2013  . Menopausal state 09/22/2013  . Atrophic vaginitis 09/22/2013    Past Medical History:  Diagnosis Date  . Abnormal liver function test    consult with Dr. 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 Anxiety 05/06/2016  . Arrhythmia 05/06/2016   During Child Birth Only  . Atrophic vaginitis    04/2013  .  Cervical polyp 04/2013   Dr. Ernestina Penna, biopsied   . H/O mammogram 01/2013   normal  . Hemorrhoids 05/06/2016  . Hyperlipidemia 05/06/2016  . Menopausal state    started Citalopram 2014   . Primary biliary cirrhosis (HCC) 2014   Dr. Elnoria Howard  . Routine gynecological  examination 04/2013   Dr. Ernestina Penna  . Shoulder bursitis    left, [facility] steroidal shots [facility]  . Varicose vein of leg 05/06/2016    Family History  Problem Relation Age of Onset  . Other Mother        accidental death  . Cancer Father        died of colon cancer age 83yo  . Heart disease Father        palpitations  . Colon cancer Father 58  . Cancer Brother        bone  . Diabetes Maternal Grandmother   . Heart disease Maternal Grandmother   . Stroke Neg Hx   . Hypertension Neg Hx    Past Surgical History:  Procedure Laterality Date  . CERVICAL BIOPSY  04/2013   Dr. Ernestina Penna, gynecology  . COLONOSCOPY  04/2013   tubular adenoma, serrated adenoma, Dr. Elnoria Howard; repeat q3 years  . 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 She appears well-developed and well-nourished.  HENT:  Head: Normocephalic and atraumatic.  Eyes: Conjunctivae and EOM are normal.  Neck: Normal range of motion.  Cardiovascular: Normal rate, regular rhythm, normal heart sounds and intact distal pulses.   Pulmonary/Chest: Effort normal and breath sounds normal.  Abdominal: Soft. Bowel sounds are normal.  Lymphadenopathy:    She has no cervical adenopathy.  Neurological: She is alert and oriented to person, place, and time.  Skin: Skin is warm and dry. Capillary refill takes less than 2 seconds.  Psychiatric: She has a normal mood and affect. Her behavior is normal.  Nursing note and vitals reviewed.    Musculoskeletal Exam: C-spine and thoracic lumbar spine good range of motion. Shoulder joints although joints wrist joints are good range of motion. She has some thickening  of PIP/DIP joints tenderness. No synovitis was noted. Hip joints knee joints ankles MTPs PIPs with good range of motion with no synovitis. She is some crepitus and discomfort with range of motion of her bilateral knee joints. She has few tender points and hyperalgesia.  CDAI Exam: No CDAI exam completed.    Investigation: No additional findings.   Imaging: No results found.  Speciality Comments: No specialty comments available.    Procedures:  No procedures performed Allergies: Citalopram and Effexor [venlafaxine]   Assessment / Plan:     Visit Diagnoses: Primary osteoarthritis of both hands: She's been having pain and stiffness in her hands. Muscle strengthening exercises and joint protection was discussed. We also discussed the use of natural anti-inflammatories. A handout on hand exercises was given.  Trochanteric bursitis of both hips: ITB and exercise were discussed.  Primary osteoarthritis of both knees: She's been having increased knee joint pain. Weight loss diet and exercise was discussed. A handout  on knee exercises was given.  Chondromalacia of both patellae  Primary osteoarthritis of both feet: Proper fitting shoes were discussed.  Vitamin D deficiency - Plan: VITAMIN D 25 Hydroxy (Vit-D Deficiency, Fractures)  Myofascial pain: She continues to have some generalized pain and discomfort.  Primary biliary cholangitis (HCC) -she's been taking Tylenol for pain. She wanted [place] to repeat her labs. Plan: COMPLETE METABOLIC PANEL WITH GFR    Orders: Orders Placed This Encounter  Procedures  . COMPLETE METABOLIC PANEL WITH GFR  . VITAMIN D 25 Hydroxy (Vit-D Deficiency, Fractures)   No orders of the defined types were placed in this encounter.     Follow-Up Instructions: Return in about 1 year (around 04/15/2018) for Osteoarthritis.   Pollyann Savoy, MD  Note - This record has been created using Animal nutritionist.  Chart creation errors have been sought, but  may not always  have been located. Such creation errors do not reflect on  the standard of medical care.

## 2017-04-16 ENCOUNTER — Telehealth: Payer: Self-pay | Admitting: Radiology

## 2017-04-16 DIAGNOSIS — E559 Vitamin D deficiency, unspecified: Secondary | ICD-10-CM

## 2017-04-16 LAB — COMPLETE METABOLIC PANEL WITH GFR
AG Ratio: 1 (calc) (ref 1.0–2.5)
ALT: 19 U/L (ref 6–29)
AST: 23 U/L (ref 10–35)
Albumin: 4.1 g/dL (ref 3.6–5.1)
Alkaline phosphatase (APISO): 111 U/L (ref 33–130)
BUN: 13 mg/dL (ref 7–25)
CO2: 27 mmol/L (ref 20–32)
Calcium: 9.4 mg/dL (ref 8.6–10.4)
Chloride: 103 mmol/L (ref 98–110)
Creat: 0.77 mg/dL (ref 0.50–0.99)
GFR, Est African American: 97 mL/min/{1.73_m2} (ref >=60)
GFR, Est Non African American: 84 mL/min/{1.73_m2} (ref >=60)
Globulin: 4 g/dL (calc) — ABNORMAL HIGH (ref 1.9–3.7)
Glucose, Bld: 76 mg/dL (ref 65–99)
Potassium: 4.7 mmol/L (ref 3.5–5.3)
Sodium: 137 mmol/L (ref 135–146)
Total Bilirubin: 0.7 mg/dL (ref 0.2–1.2)
Total Protein: 8.1 g/dL (ref 6.1–8.1)

## 2017-04-16 LAB — VITAMIN D 25 HYDROXY (VIT D DEFICIENCY, FRACTURES): Vit D, 25-Hydroxy: 28 ng/mL — ABNORMAL LOW (ref 30–100)

## 2017-04-16 MED ORDER — VITAMIN D3 1.25 MG (50000 UT) PO CAPS: 50000.0000 [IU] | ORAL_CAPSULE | ORAL | 0 refills | Status: DC

## 2017-04-16 NOTE — Telephone Encounter (Signed)
Sent in Meds/ sent my chart message and put in labs order

## 2017-04-16 NOTE — Telephone Encounter (Signed)
-----   Message from Pollyann Savoy, MD sent at 04/16/2017  9:05 AM EDT ----- Vit D deficiency . Call in Vit D 50,000 U q wk #90d check level in 3 months.

## 2017-04-16 NOTE — Progress Notes (Signed)
Vit D deficiency . Call in Vit D 50,000 U q wk #90d check level in 3 months.

## 2017-04-21 ENCOUNTER — Other Ambulatory Visit: Payer: Self-pay | Admitting: Rheumatology

## 2017-04-30 ENCOUNTER — Other Ambulatory Visit: Payer: Self-pay | Admitting: Medical

## 2017-04-30 ENCOUNTER — Encounter: Payer: Self-pay | Admitting: Medical

## 2017-04-30 NOTE — Telephone Encounter (Signed)
Can she have a refill on this 

## 2017-04-30 NOTE — Telephone Encounter (Signed)
Refill and get in for physical before next refill

## 2017-05-11 ENCOUNTER — Encounter: Payer: BLUE CROSS/BLUE SHIELD | Admitting: Medical

## 2017-05-20 ENCOUNTER — Ambulatory Visit: Payer: BLUE CROSS/BLUE SHIELD | Admitting: Medical

## 2017-05-20 ENCOUNTER — Encounter: Payer: Self-pay | Admitting: Medical

## 2017-05-20 VITALS — BP 130/82 | HR 65 | Ht 68.0 in | Wt 188.8 lb

## 2017-05-20 DIAGNOSIS — F329 Major depressive disorder, single episode, unspecified: Secondary | ICD-10-CM | POA: Diagnosis not present

## 2017-05-20 DIAGNOSIS — I8393 Asymptomatic varicose veins of bilateral lower extremities: Secondary | ICD-10-CM | POA: Diagnosis not present

## 2017-05-20 DIAGNOSIS — Z131 Encounter for screening for diabetes mellitus: Secondary | ICD-10-CM | POA: Insufficient documentation

## 2017-05-20 DIAGNOSIS — Z0000 Encounter for general adult medical examination without abnormal findings: Secondary | ICD-10-CM

## 2017-05-20 DIAGNOSIS — E785 Hyperlipidemia, unspecified: Secondary | ICD-10-CM

## 2017-05-20 DIAGNOSIS — M170 Bilateral primary osteoarthritis of knee: Secondary | ICD-10-CM

## 2017-05-20 DIAGNOSIS — F419 Anxiety disorder, unspecified: Secondary | ICD-10-CM | POA: Diagnosis not present

## 2017-05-20 DIAGNOSIS — N951 Menopausal and female climacteric states: Secondary | ICD-10-CM | POA: Diagnosis not present

## 2017-05-20 DIAGNOSIS — Z139 Encounter for screening, unspecified: Secondary | ICD-10-CM | POA: Insufficient documentation

## 2017-05-20 DIAGNOSIS — R635 Abnormal weight gain: Secondary | ICD-10-CM

## 2017-05-20 DIAGNOSIS — Z7189 Other specified counseling: Secondary | ICD-10-CM | POA: Diagnosis not present

## 2017-05-20 DIAGNOSIS — Z23 Encounter for immunization: Secondary | ICD-10-CM | POA: Diagnosis not present

## 2017-05-20 DIAGNOSIS — K743 Primary biliary cirrhosis: Secondary | ICD-10-CM

## 2017-05-20 DIAGNOSIS — E559 Vitamin D deficiency, unspecified: Secondary | ICD-10-CM | POA: Diagnosis not present

## 2017-05-20 DIAGNOSIS — R4589 Other symptoms and signs involving emotional state: Secondary | ICD-10-CM

## 2017-05-20 DIAGNOSIS — Z7185 Encounter for immunization safety counseling: Secondary | ICD-10-CM

## 2017-05-20 LAB — POCT URINALYSIS DIP (PROADVANTAGE DEVICE)
Bilirubin, UA: NEGATIVE
Blood, UA: NEGATIVE
Glucose, UA: NEGATIVE mg/dL
Ketones, POC UA: NEGATIVE mg/dL
Leukocytes, UA: NEGATIVE
Nitrite, UA: NEGATIVE
Protein Ur, POC: NEGATIVE mg/dL
Specific Gravity, Urine: 1.015
Urobilinogen, Ur: NEGATIVE
pH, UA: 6.5 (ref 5.0–8.0)

## 2017-05-20 MED ORDER — VITAMIN D3 1.25 MG (50000 UT) PO CAPS
50000.0000 [IU] | ORAL_CAPSULE | ORAL | 3 refills | Status: AC
Start: 2017-05-20 — End: 2017-08-18

## 2017-05-20 MED ORDER — PHENTERMINE HCL 37.5 MG PO TABS: 37.5000 mg | ORAL_TABLET | Freq: Every day | ORAL | 1 refills | Status: DC

## 2017-05-20 NOTE — Progress Notes (Signed)
Subjective: Chief Complaint  Patient presents with  . Annual Exam    physical , pt is fasting for labs    Here for physical.  Medical team: Gynecology, Dr. Raye SorrowFolgeman, sees them next week for pap and has mammogram next week Dr. Elnoria HowardHung, GI Dr. Corliss Skainseveshwar, Ortho/Rheumatoid Yarnell Arvidson, Kermit Baloavid S, PA-C here for primary care.  Concerns: Vit D deficiency.   Not taking anything currently.  Needs refill.    Been feeling health in general.  Mood is good. Taking Wellbutrin 300mg  daily.    Nonsmoker.   Exercising walking regularly.   Eating healthy.    Here for flu shot.  Had bone density scan with Dr. Mancel Parsonsevewhwar  Wants refill on phentermine to help particular through holidays to avoid overeating.     Past Medical History:  Diagnosis Date  . Abnormal liver function test    consult with Dr. 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 Anxiety 05/06/2016  . Arrhythmia 05/06/2016   During Child Birth Only  . Atrophic vaginitis    04/2013  . Cervical polyp 04/2013   Dr. Ernestina PennaFogleman, biopsied   . H/O mammogram 01/2013   normal  . Hemorrhoids 05/06/2016  . Hyperlipidemia 05/06/2016  . Menopausal state    started Citalopram 2014   . Primary biliary cirrhosis (HCC) 2014   Dr. Elnoria HowardHung  . Routine gynecological examination    Dr. Ernestina PennaFogleman  . Shoulder bursitis    left, prior steroidal shots at [facility]  . 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 CERVICAL BIOPSY  04/2013   Dr. Ernestina PennaFogleman, gynecology  . COLONOSCOPY     10/2016, prior in 2014, tubular adenoma, serrated adenoma, Dr. Elnoria HowardHung; repeat q3 years  . LIVER BIOPSY  05/2013    Social History   Socioeconomic History  . Marital status: Married    Spouse name: Not on file  . Number of children: Not on file  . Years of education: Not on file  . Highest education level: Not on file  Social Needs  . Financial resource strain: Not  on file  . Food insecurity - worry: Not on file  . Food insecurity - inability: Not on file  . Transportation needs - medical: Not on file  . Transportation needs - non-medical: Not on file  Occupational History  . Not on file  Tobacco Use  . Smoking status: Former Smoker    Packs/day: 1.00    Years: 20.00    Pack years: 20.00    Types: Cigarettes    Last attempt to quit: 07/08/1983    Years since quitting: 33.8  . Smokeless tobacco: Never Used  Substance and Sexual Activity  . Alcohol use: Yes    Alcohol/week: 6.0 oz    Types: 10 Glasses of wine per week  . Drug use: No  . Sexual activity: Not on file  Other Topics Concern  . 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    05/2017    Family History  Problem Relation Age of Onset  . Other Mother        accidental death  . Cancer Father        died of colon cancer age 60yo  . Heart disease Father  palpitations  . Colon cancer Father 48  . Cancer Brother        bone  . Diabetes Maternal Grandmother   . Heart disease Maternal Grandmother   . Stroke Neg Hx   . Hypertension Neg Hx      Current Outpatient Medications:  .  buPROPion (WELLBUTRIN XL) 300 MG 24 hr tablet, TAKE ONE TABLET BY MOUTH DAILY, Disp: 90 tablet, Rfl: 0 .  ibuprofen (ADVIL,MOTRIN) 200 MG tablet, Take 400 mg by mouth daily as needed (pain). Reported on 10/11/2015, Disp: , Rfl:  .  Multiple Vitamin (MULTIVITAMIN WITH MINERALS) TABS tablet, Take 1 tablet by mouth at bedtime., Disp: , Rfl:  .  phentermine (ADIPEX-P) 37.5 MG tablet, Take 1 tablet (37.5 mg total) daily before breakfast by mouth., Disp: 30 tablet, Rfl: 1 .  Cholecalciferol (VITAMIN D3) 50000 units CAPS, Take 50,000 Units once a week by mouth., Disp: 12 capsule, Rfl: 3 .  ursodiol (ACTIGALL) 300 MG capsule, Take 600 mg by mouth 2 (two) times daily. ,  Disp: , Rfl:   Allergies  Allergen Reactions  . Citalopram     intolerance  . Effexor [Venlafaxine]     Mental fog    Review of Systems Constitutional: -fever, -chills, -sweats, -unexpected weight change, -decreased appetite, -fatigue Allergy: -sneezing, -itching, -congestion Dermatology: -changing moles, --rash, -lumps ENT: -runny nose, -ear pain, -sore throat, -hoarseness, -sinus pain, -teeth pain, - ringing in ears, -hearing loss, -nosebleeds Cardiology: -chest pain, -palpitations, -swelling, -difficulty breathing when lying flat, -waking up short of breath Respiratory: -cough, -shortness of breath, -difficulty breathing with exercise or exertion, -wheezing, -coughing up blood Gastroenterology: -abdominal pain, -nausea, -vomiting, -diarrhea, -constipation, -blood in stool, -changes in bowel movement, -difficulty swallowing or eating Hematology: -bleeding, -bruising  Musculoskeletal: -joint aches, -muscle aches, -joint swelling, -back pain, -neck pain, -cramping, -changes in gait Ophthalmology: denies vision changes, eye redness, itching, discharge Urology: -burning with urination, -difficulty urinating, -blood in urine, -urinary frequency, -urgency, -incontinence Neurology: -headache, -weakness, -tingling, -numbness, -memory loss, -falls, -dizziness Psychology: -depressed mood, -agitation, -sleep problems     Objective:   BP 130/82   Pulse 65   Ht 5\' 8"  (1.727 m)   Wt 188 lb 12.8 oz (85.6 kg)   SpO2 99%   BMI 28.71 kg/m   General appearance: alert, no distress, WD/WN, Caucasian female Skin: scattered macules, no worrisome lesions HEENT: normocephalic, conjunctiva/corneas normal, sclerae anicteric, PERRLA, EOMi, nares patent, no discharge or erythema, pharynx normal Oral cavity: MMM, tongue normal, teeth in good repair Neck: supple, no lymphadenopathy, no thyromegaly, no masses, normal ROM, no bruits Chest: non tender, normal shape and expansion Heart: RRR, normal S1, S2, no  murmurs Lungs: CTA bilaterally, no wheezes, rhonchi, or rales Abdomen: +bs, soft, non tender, non distended, no masses, no hepatomegaly, no splenomegaly, no bruits Back: non tender, normal ROM, no scoliosis Musculoskeletal: upper extremities non tender, no obvious deformity, normal ROM throughout, lower extremities non tender, no obvious deformity, normal ROM throughout Extremities: no edema, no cyanosis, no clubbing Pulses: 2+ symmetric, upper and lower extremities, normal cap refill Neurological: alert, oriented x 3, CN2-12 intact, strength normal upper extremities and lower extremities, sensation normal throughout, DTRs 2+ throughout, no cerebellar signs, gait normal Psychiatric: normal affect, behavior normal, pleasant  Gyn: breast/gyn/rectal - deferred to gyn   Assessment and Plan :    Encounter Diagnoses  Name Primary?  . Encounter for health maintenance examination in adult Yes  . Need for influenza vaccination   . Vaccine counseling   .  Varicose veins of both lower extremities, unspecified whether complicated   . Primary biliary cholangitis (HCC)   . Depressed mood   . Anxiety   . Primary osteoarthritis of both knees   . Vitamin D deficiency   . Menopausal state   . Hyperlipidemia, unspecified hyperlipidemia type   . Weight gain     Physical exam - discussed and counseled on healthy lifestyle, diet, exercise, preventative care, vaccinations, sick and well care, proper use of emergency dept and after hours care, and addressed their concerns.    She will discuss treatment options for hot flashes with Dr. Ernestina PennaFogleman next week.   Cancer screening She is scheduled to see gyn soon for mammogram, pap Up to date on colonoscopy   Patient Instructions  Recommendations: Check insurance coverage for the following weight loss medications: Qsymia, Saxenda, Contrave  I recommend you have a shingles vaccine to help prevent shingles or herpes zoster outbreak.   Please call your  insurer to inquire about coverage for the Shingrix vaccine given in 2 doses.   Some insurers cover this vaccine after age 60, some cover this after age 60.  If your insurer covers this, then call to schedule appointment to have this vaccine here.  See your eye doctor yearly for routine vision care. See your dentist yearly for routine dental care including hygiene visits twice yearly. See your gynecologist yearly for routine gynecological care. See me yearly for physical  Ask Dr. Ernestina PennaFogleman about medication options for hot flashes including hormone patch vs Clonidine or other   Exercise daily, continue to eat a healthy low fat diet  Limit alcohol to 1 or less drinks daily          [name] was seen today for annual exam.  Diagnoses and all orders for this visit:  Encounter for health maintenance examination in adult -     CBC with Differential/Platelet -     Lipid panel -     Hemoglobin A1c -     POCT Urinalysis DIP (Proadvantage Device)  Need for influenza vaccination -     Flu Vaccine QUAD 6+ mos PF IM (Fluarix Quad PF)  Vaccine counseling  Varicose veins of both lower extremities, unspecified whether complicated  Primary biliary cholangitis (HCC)  Depressed mood  Anxiety  Primary osteoarthritis of both knees  Vitamin D deficiency  Menopausal state  Hyperlipidemia, unspecified hyperlipidemia type -     Lipid panel  Weight gain  Other orders -     phentermine (ADIPEX-P) 37.5 MG tablet; Take 1 tablet (37.5 mg total) daily before breakfast by mouth. -     Cholecalciferol (VITAMIN D3) 50000 units CAPS; Take 50,000 Units once a week by mouth.   Follow-up pending labs, yearly for physical

## 2017-05-20 NOTE — Patient Instructions (Signed)
Recommendations: Check insurance coverage for the following weight loss medications: Qsymia, Saxenda, Contrave  I recommend you have a shingles vaccine to help prevent shingles or herpes zoster outbreak.   Please call your insurer to inquire about coverage for the Shingrix vaccine given in 2 doses.   Some insurers cover this vaccine after age 60, some cover this after age 60.  If your insurer covers this, then call to schedule appointment to have this vaccine here.  See your eye doctor yearly for routine vision care. See your dentist yearly for routine dental care including hygiene visits twice yearly. See your gynecologist yearly for routine gynecological care. See me yearly for physical  Ask Dr. Ernestina PennaFogleman about medication options for hot flashes including hormone patch vs Clonidine or other   Exercise daily, continue to eat a healthy low fat diet  Limit alcohol to 1 or less drinks daily

## 2017-05-21 ENCOUNTER — Other Ambulatory Visit: Payer: Self-pay | Admitting: Medical

## 2017-05-21 LAB — CBC WITH DIFFERENTIAL/PLATELET
Basophils Absolute: 11 cells/uL (ref 0–200)
Basophils Relative: 0.2 %
Eosinophils Absolute: 61 cells/uL (ref 15–500)
Eosinophils Relative: 1.1 %
HCT: 40.8 % (ref 35.0–45.0)
Hemoglobin: 14.2 g/dL (ref 11.7–15.5)
Lymphs Abs: 2464 cells/uL (ref 850–3900)
MCH: 33.1 pg — ABNORMAL HIGH (ref 27.0–33.0)
MCHC: 34.8 g/dL (ref 32.0–36.0)
MCV: 95.1 fL (ref 80.0–100.0)
MPV: 12.9 fL — ABNORMAL HIGH (ref 7.5–12.5)
Monocytes Relative: 8.4 %
Neutro Abs: 2503 cells/uL (ref 1500–7800)
Neutrophils Relative %: 45.5 %
Platelets: 203 10*3/uL (ref 140–400)
RBC: 4.29 10*6/uL (ref 3.80–5.10)
RDW: 11.9 % (ref 11.0–15.0)
Total Lymphocyte: 44.8 %
WBC mixed population: 462 cells/uL (ref 200–950)
WBC: 5.5 10*3/uL (ref 3.8–10.8)

## 2017-05-21 LAB — LIPID PANEL
Cholesterol: 212 mg/dL — ABNORMAL HIGH (ref <200)
HDL: 62 mg/dL (ref >50)
LDL Cholesterol (Calc): 125 mg/dL (calc) — ABNORMAL HIGH
Non-HDL Cholesterol (Calc): 150 mg/dL (calc) — ABNORMAL HIGH (ref <130)
Total CHOL/HDL Ratio: 3.4 (calc) (ref <5.0)
Triglycerides: 130 mg/dL (ref <150)

## 2017-05-21 LAB — HEMOGLOBIN A1C
Hgb A1c MFr Bld: 5.3 % of total Hgb (ref <5.7)
Mean Plasma Glucose: 105 (calc)
eAG (mmol/L): 5.8 (calc)

## 2017-05-21 MED ORDER — PRAVASTATIN SODIUM 20 MG PO TABS: 20.0000 mg | ORAL_TABLET | Freq: Every evening | ORAL | 0 refills | Status: DC

## 2017-05-25 LAB — HM MAMMOGRAPHY

## 2017-05-27 ENCOUNTER — Encounter: Payer: Self-pay | Admitting: Medical

## 2017-06-12 ENCOUNTER — Encounter: Payer: Self-pay | Admitting: Medical

## 2017-06-16 ENCOUNTER — Other Ambulatory Visit: Payer: Self-pay | Admitting: Medical

## 2017-06-16 MED ORDER — ROSUVASTATIN CALCIUM 10 MG PO TABS: 10.0000 mg | ORAL_TABLET | Freq: Every day | ORAL | 1 refills | Status: DC

## 2017-06-23 ENCOUNTER — Encounter: Payer: Self-pay | Admitting: Medical

## 2017-06-24 ENCOUNTER — Encounter: Payer: Self-pay | Admitting: Medical

## 2017-06-25 ENCOUNTER — Other Ambulatory Visit: Payer: Self-pay | Admitting: Medical

## 2017-06-25 ENCOUNTER — Telehealth: Payer: Self-pay | Admitting: Medical

## 2017-06-25 DIAGNOSIS — N951 Menopausal and female climacteric states: Secondary | ICD-10-CM

## 2017-06-25 DIAGNOSIS — G4700 Insomnia, unspecified: Secondary | ICD-10-CM

## 2017-06-25 DIAGNOSIS — R4586 Emotional lability: Secondary | ICD-10-CM

## 2017-06-25 DIAGNOSIS — Z780 Asymptomatic menopausal state: Secondary | ICD-10-CM

## 2017-06-25 DIAGNOSIS — R454 Irritability and anger: Secondary | ICD-10-CM

## 2017-06-25 MED ORDER — ALPRAZOLAM 0.5 MG PO TABS: 0.5000 mg | ORAL_TABLET | Freq: Every evening | ORAL | 0 refills | Status: DC | PRN

## 2017-06-25 NOTE — Telephone Encounter (Signed)
Called and l/m to let pt know.

## 2017-06-25 NOTE — Telephone Encounter (Signed)
Call out Alprazolam no refill

## 2017-06-25 NOTE — Telephone Encounter (Signed)
done

## 2017-06-25 NOTE — Telephone Encounter (Signed)
She emailed about concerns for sleep apnea.     I don't know if we have discussed this prior in detail.  So lets get her in for face to face discussion so we can set up sleep study if warranted.  She should probably go ahead and check insurance for coverage for 2019

## 2017-07-22 ENCOUNTER — Other Ambulatory Visit: Payer: Self-pay | Admitting: Medical

## 2017-07-22 NOTE — Telephone Encounter (Signed)
Can pt have a refill on meds  

## 2017-08-12 ENCOUNTER — Encounter: Payer: Self-pay | Admitting: Medical

## 2017-10-20 ENCOUNTER — Other Ambulatory Visit: Payer: Self-pay | Admitting: Medical

## 2017-10-20 NOTE — Telephone Encounter (Signed)
Okay to submit?  Thanks, last seen 2018

## 2017-12-26 ENCOUNTER — Other Ambulatory Visit: Payer: Self-pay | Admitting: Medical

## 2017-12-28 NOTE — Telephone Encounter (Signed)
Is this ok to refill?  

## 2018-01-11 ENCOUNTER — Other Ambulatory Visit: Payer: Self-pay | Admitting: Medical

## 2018-01-11 ENCOUNTER — Telehealth: Payer: Self-pay

## 2018-01-11 NOTE — Telephone Encounter (Signed)
Is this ok to refill?  

## 2018-01-11 NOTE — Telephone Encounter (Signed)
Refill medication but let us get her in for fasting med check since we have started cholesterol medicine last visit

## 2018-01-11 NOTE — Telephone Encounter (Signed)
Left message for patient to call back to make an appointment for fasting med check

## 2018-02-19 ENCOUNTER — Other Ambulatory Visit: Payer: Self-pay | Admitting: Medical

## 2018-02-22 ENCOUNTER — Other Ambulatory Visit: Payer: Self-pay

## 2018-02-23 ENCOUNTER — Other Ambulatory Visit: Payer: Self-pay

## 2018-02-23 ENCOUNTER — Other Ambulatory Visit: Payer: Self-pay | Admitting: Medical

## 2018-02-23 DIAGNOSIS — R4586 Emotional lability: Secondary | ICD-10-CM

## 2018-02-23 MED ORDER — BUPROPION HCL ER (XL) 300 MG PO TB24: 300.0000 mg | ORAL_TABLET | Freq: Every day | ORAL | 0 refills | Status: DC

## 2018-02-26 ENCOUNTER — Encounter: Payer: BLUE CROSS/BLUE SHIELD | Admitting: Medical

## 2018-03-23 ENCOUNTER — Other Ambulatory Visit: Payer: Self-pay | Admitting: Medical

## 2018-03-23 NOTE — Telephone Encounter (Signed)
Is this ok to refill?  

## 2018-03-23 NOTE — Telephone Encounter (Signed)
Per last message, get in for fasting med check

## 2018-04-15 ENCOUNTER — Ambulatory Visit: Payer: BLUE CROSS/BLUE SHIELD | Admitting: Rheumatology

## 2018-05-25 ENCOUNTER — Encounter: Payer: Self-pay | Admitting: Medical

## 2018-05-25 ENCOUNTER — Ambulatory Visit: Payer: BLUE CROSS/BLUE SHIELD | Admitting: Medical

## 2018-05-25 VITALS — BP 126/74 | HR 58 | Temp 98.0°F | Resp 16 | Ht 68.5 in | Wt 193.4 lb

## 2018-05-25 DIAGNOSIS — E559 Vitamin D deficiency, unspecified: Secondary | ICD-10-CM | POA: Diagnosis not present

## 2018-05-25 DIAGNOSIS — K743 Primary biliary cirrhosis: Secondary | ICD-10-CM

## 2018-05-25 DIAGNOSIS — Z7185 Encounter for immunization safety counseling: Secondary | ICD-10-CM

## 2018-05-25 DIAGNOSIS — E785 Hyperlipidemia, unspecified: Secondary | ICD-10-CM

## 2018-05-25 DIAGNOSIS — R4589 Other symptoms and signs involving emotional state: Secondary | ICD-10-CM

## 2018-05-25 DIAGNOSIS — Z0000 Encounter for general adult medical examination without abnormal findings: Secondary | ICD-10-CM | POA: Diagnosis not present

## 2018-05-25 DIAGNOSIS — Z23 Encounter for immunization: Secondary | ICD-10-CM

## 2018-05-25 DIAGNOSIS — R454 Irritability and anger: Secondary | ICD-10-CM

## 2018-05-25 DIAGNOSIS — R232 Flushing: Secondary | ICD-10-CM

## 2018-05-25 DIAGNOSIS — Z7189 Other specified counseling: Secondary | ICD-10-CM

## 2018-05-25 DIAGNOSIS — G4700 Insomnia, unspecified: Secondary | ICD-10-CM

## 2018-05-25 DIAGNOSIS — I8393 Asymptomatic varicose veins of bilateral lower extremities: Secondary | ICD-10-CM

## 2018-05-25 DIAGNOSIS — Z131 Encounter for screening for diabetes mellitus: Secondary | ICD-10-CM

## 2018-05-25 DIAGNOSIS — Z780 Asymptomatic menopausal state: Secondary | ICD-10-CM

## 2018-05-25 DIAGNOSIS — R4586 Emotional lability: Secondary | ICD-10-CM

## 2018-05-25 DIAGNOSIS — F329 Major depressive disorder, single episode, unspecified: Secondary | ICD-10-CM

## 2018-05-25 DIAGNOSIS — M170 Bilateral primary osteoarthritis of knee: Secondary | ICD-10-CM

## 2018-05-25 DIAGNOSIS — F419 Anxiety disorder, unspecified: Secondary | ICD-10-CM

## 2018-05-25 DIAGNOSIS — N951 Menopausal and female climacteric states: Secondary | ICD-10-CM

## 2018-05-25 LAB — POCT URINALYSIS DIP (PROADVANTAGE DEVICE)
Bilirubin, UA: NEGATIVE
Blood, UA: NEGATIVE
Glucose, UA: NEGATIVE mg/dL
Ketones, POC UA: NEGATIVE mg/dL
Leukocytes, UA: NEGATIVE
Nitrite, UA: NEGATIVE
Protein Ur, POC: NEGATIVE mg/dL
Specific Gravity, Urine: 1.01
Urobilinogen, Ur: NEGATIVE
pH, UA: 6 (ref 5.0–8.0)

## 2018-05-25 MED ORDER — ALPRAZOLAM 0.5 MG PO TABS: 0.5000 mg | ORAL_TABLET | Freq: Every evening | ORAL | 1 refills | Status: DC | PRN

## 2018-05-25 MED ORDER — RISPERIDONE 0.5 MG PO TABS: 0.5000 mg | ORAL_TABLET | Freq: Two times a day (BID) | ORAL | 1 refills | Status: DC

## 2018-05-25 MED ORDER — BUPROPION HCL ER (XL) 300 MG PO TB24: 300.0000 mg | ORAL_TABLET | Freq: Every day | ORAL | 2 refills | Status: DC

## 2018-05-25 NOTE — Progress Notes (Signed)
Subjective: Chief Complaint  Patient presents with  . CPE    fasting cpe    Medical team: Dentist No eye doctor Dr. Pollyann SavoyShaili Deveshwar, rheumatology Dr. Jeani HawkingPatrick Hung, GI Dr. Noland FordyceKelly Fogleman, [facility] Raford Brissett, Kermit Baloavid S, PA-C here for primary care  Concerns Here for flu shot  She has a history of vitamin D deficiency but not currently taking vitamin D   She saw Dr. Elnoria HowardHung, gastro couple months ago.  Things are relatively stable with the cholangitis, compliant with treatment for this   Her main issues currently is depressed mood.  She has not done well on citalopram and Effexor in the past.  Wellbutrin helps but still not quite what she needs.  Request some medication adjustment today.  Has seen counselor in the past but not currently.  No prior psychiatry eval  She feels down quite often but does have mood swings, at times can be wired up, at times high anxiety.  She is really stressed out about family coming into town for the holidays.  She would like to do more traveling and like to do some home remodeling but they are kind of in a holding pattern as her husband's mother is 61 years old at [facility].  They look after her issues, and they still take care of their 61-year-old grandson 3/4 of the time.  Denies suicidal ideation.  Denies hallucinations or delusions.  She lives at home with her husband and 61-year-old grandson.  Her and husband have a good relationship.  Not sure about family hx/o mental healthy problems.   Lost mom when she was 16yo.  She thinks her mom had depression and had possible shock therapy.   Reviewed their medical, surgical, family, social, medication, and allergy history and updated chart as appropriate.  Past Medical History:  Diagnosis Date  . 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 Anxiety 05/06/2016  . Arrhythmia 05/06/2016   During Child Birth Only  . Atrophic vaginitis    04/2013  . Cervical polyp 04/2013   Dr. Ernestina PennaFogleman,  biopsied   . H/O mammogram 01/2013   normal  . Hemorrhoids 05/06/2016  . Hyperlipidemia 05/06/2016  . Menopausal state    started Citalopram 2014   . Primary biliary cirrhosis (HCC) 2014   Dr. Elnoria HowardHung  . Routine gynecological examination    Dr. Ernestina PennaFogleman  . Shoulder bursitis    left, prior steroidal shots at Urgent Care  . 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 CERVICAL BIOPSY  04/2013   Dr. Ernestina PennaFogleman, gynecology  . COLONOSCOPY     10/2016, prior in 2014, tubular adenoma, serrated adenoma, Dr. Elnoria HowardHung; repeat q3 years  . LIVER BIOPSY  05/2013    Social History   Socioeconomic History  . Marital status: Married    Spouse name: Not on file  . Number of children: Not on file  . Years of education: Not on file  . Highest education level: Not on file  Occupational History  . Not on file  Social Needs  . Financial resource strain: Not on file  . Food insecurity:    Worry: Not on file    Inability: Not on file  . Transportation needs:    Medical: Not on file    Non-medical: Not on file  Tobacco Use  . Smoking status: Former Smoker    Packs/day: 1.00    Years: 20.00    Pack years: 20.00  Types: Cigarettes    Last attempt to quit: 07/08/1983    Years since quitting: 34.9  . Smokeless tobacco: Never Used  Substance and Sexual Activity  . Alcohol use: Yes    Alcohol/week: 14.0 standard drinks    Types: 14 Glasses of wine per week  . Drug use: No  . Sexual activity: Not on file  Lifestyle  . Physical activity:    Days per week: Not on file    Minutes per session: Not on file  . Stress: Not on file  Relationships  . Social connections:    Talks on phone: Not on file    Gets together: Not on file    Attends religious service: Not on file    Active member of club or organization: Not on file    Attends meetings of clubs or organizations: Not on file    Relationship status: Not on file  . Intimate partner violence:    Fear  of current or ex partner: Not on file    Emotionally abused: Not on file    Physically abused: Not on file    Forced sexual activity: Not on file  Other Topics Concern  . Not on file  Social History Narrative   Married, 2 children, age 48yo and 60yo, 6 grandchildren, and she watches one of the 61 yo,  exercise - walk often, has [employer], homemaker.    05/2017    Family History  Problem Relation Age of Onset  . Other Mother        accidental death  . Cancer Father        died of colon cancer age 7yo  . Heart disease Father        palpitations  . Colon cancer Father 51  . Cancer Brother        bone  . Diabetes Maternal Grandmother   . Heart disease Maternal Grandmother   . Stroke Neg Hx   . Hypertension Neg Hx      Current Outpatient Medications:  .  ALPRAZolam (XANAX) 0.5 MG tablet, Take 1 tablet (0.5 mg total) by mouth at bedtime as needed., Disp: 30 tablet, Rfl: 1 .  buPROPion (WELLBUTRIN XL) 300 MG 24 hr tablet, Take 1 tablet (300 mg total) by mouth daily., Disp: 30 tablet, Rfl: 2 .  ibuprofen (ADVIL,MOTRIN) 200 MG tablet, Take 400 mg by mouth daily as needed (pain). Reported on 10/11/2015, Disp: , Rfl:  .  Multiple Vitamin (MULTIVITAMIN WITH MINERALS) TABS tablet, Take 1 tablet by mouth at bedtime., Disp: , Rfl:  .  rosuvastatin (CRESTOR) 10 MG tablet, TAKE ONE TABLET BY MOUTH EVERY NIGHT AT BEDTIME, Disp: 90 tablet, Rfl: 0 .  ursodiol (ACTIGALL) 300 MG capsule, Take 600 mg by mouth 2 (two) times daily. , Disp: , Rfl:  .  risperiDONE (RISPERDAL) 0.5 MG tablet, Take 1 tablet (0.5 mg total) by mouth 2 (two) times daily., Disp: 60 tablet, Rfl: 1  Allergies  Allergen Reactions  . Citalopram     intolerance  . Effexor [Venlafaxine]     Mental fog     Review of Systems Constitutional: -fever, -chills, -sweats, -unexpected weight change, -decreased appetite, -fatigue Allergy: -sneezing, -itching, -congestion Dermatology: -changing moles, --rash, -lumps ENT: -runny  nose, -ear pain, -sore throat, -hoarseness, -sinus pain, -teeth pain, - ringing in ears, -hearing loss, -nosebleeds Cardiology: -chest pain, -palpitations, -swelling, -difficulty breathing when lying flat, -waking up short of breath Respiratory: -cough, -shortness of breath, -difficulty breathing with exercise or  exertion, -wheezing, -coughing up blood Gastroenterology: -abdominal pain, -nausea, -vomiting, -diarrhea, -constipation, -blood in stool, -changes in bowel movement, -difficulty swallowing or eating Hematology: -bleeding, -bruising  Musculoskeletal: -joint aches, -muscle aches, -joint swelling, -back pain, -neck pain, -cramping, -changes in gait Ophthalmology: denies vision changes, eye redness, itching, discharge Urology: -burning with urination, -difficulty urinating, -blood in urine, -urinary frequency, -urgency, -incontinence Neurology: -headache, -weakness, -tingling, -numbness, -memory loss, -falls, -dizziness Psychology: +depressed mood, -agitation, -sleep problems Breast/gyn: -breast tenderness, -discharge, -lumps, -vaginal discharge,- irregular periods, -heavy periods      Objective:  BP 126/74   Pulse (!) 58   Temp 98 F (36.7 C) (Oral)   Resp 16   Ht 5' 8.5" (1.74 m)   Wt 193 lb 6.4 oz (87.7 kg)   SpO2 99%   BMI 28.98 kg/m   General appearance: alert, no distress, WD/WN, Caucasian female Skin: scattered macules, no worrisome lesions HEENT: normocephalic, conjunctiva/corneas normal, sclerae anicteric, PERRLA, EOMi, nares patent, no discharge or erythema, pharynx normal Oral cavity: MMM, tongue normal, teeth in good repair Neck: supple, no lymphadenopathy, no thyromegaly, no masses, normal ROM, no bruits Chest: non tender, normal shape and expansion Heart: RRR, normal S1, S2, no murmurs Lungs: CTA bilaterally, no wheezes, rhonchi, or rales Abdomen: +bs, soft, non tender, non distended, no masses, no hepatomegaly, no splenomegaly, no bruits Back: non tender, normal  ROM, no scoliosis Musculoskeletal: upper extremities non tender, no obvious deformity, normal ROM throughout, lower extremities non tender, no obvious deformity, normal ROM throughout Extremities: no edema, no cyanosis, no clubbing Pulses: 2+ symmetric, upper and lower extremities, normal cap refill Neurological: alert, oriented x 3, CN2-12 intact, strength normal upper extremities and lower extremities, sensation normal throughout, DTRs 2+ throughout, no cerebellar signs, gait normal Psychiatric: normal affect, behavior normal, pleasant  breast/gyn/rectal - deferred to gynecology  Assessment and Plan :   Encounter Diagnoses  Name Primary?  . Routine general medical examination at a health care facility Yes  . Need for influenza vaccination   . Primary biliary cholangitis (HCC)   . Vitamin D deficiency   . Vaccine counseling   . Menopausal state   . Hyperlipidemia, unspecified hyperlipidemia type   . Depressed mood   . Anxiety   . Varicose veins of both lower extremities, unspecified whether complicated   . Hot flashes   . Primary osteoarthritis of both knees   . Mood swings   . Screening for diabetes mellitus   . Mood swing   . Menopause   . Hot flash, menopausal   . Irritability   . Insomnia     Physical exam - discussed and counseled on healthy lifestyle, diet, exercise, preventative care, vaccinations, sick and well care, proper use of emergency dept and after hours care, and addressed their concerns.    Health screening: Bone density -she reports having a bone density scan through rheumatology, Dr. Corliss Skains See your eye doctor yearly for routine vision care. See your dentist yearly for routine dental care including hygiene visits twice yearly.  Cancer screening She is up-to-date on mammogram and Pap smear through Dr. Algie Coffer at [facility], we will request most recent records  She is up-to-date on colonoscopy through Dr. Jeani Hawking   Vaccinations: Advised  yearly influenza vaccine Counseled on the influenza virus vaccine.  Vaccine information sheet given.  Influenza vaccine given after consent obtained.  Shingles vaccine:  I recommend you have a shingles vaccine to help prevent shingles or herpes zoster outbreak.   Please call your  insurer to inquire about coverage for the Shingrix vaccine given in 2 doses.   Some insurers cover this vaccine after age 71, some cover this after age 42.  If your insurer covers this, then call to schedule appointment to have this vaccine here.   Acute issues discussed: None   Separate significant chronic issues discussed: Depression, mood swings, anxiety-refilled medications for Wellbutrin and Xanax, begin trial of risperidone.  She is failed other medicines in the past including the ones listed in the allergies today.  We discussed the risks and benefits of medication.  Advised counseling.  If we do not see big improvements over the next month, consider psychiatry eval  Hyperlipidemia-compliant with Crestor, follow-up pending labs  Cholangitis-reviewed last GI notes, continue current medication and follow-up with Dr. Elnoria Howard  Continue routine gynecological care with [facility] at [facility]  Michelle Mcmahon was seen today for cpe.  Diagnoses and all orders for this visit:  Routine general medical examination at a health care facility -     POCT Urinalysis DIP (Proadvantage Device) -     VITAMIN D 25 Hydroxy (Vit-D Deficiency, Fractures) -     CBC -     TSH -     Hemoglobin A1c  Need for influenza vaccination -     Flu Vaccine QUAD 6+ mos PF IM (Fluarix Quad PF)  Primary biliary cholangitis (HCC)  Vitamin D deficiency -     VITAMIN D 25 Hydroxy (Vit-D Deficiency, Fractures)  Vaccine counseling  Menopausal state  Hyperlipidemia, unspecified hyperlipidemia type -     Lipid panel  Depressed mood  Anxiety  Varicose veins of both lower extremities, unspecified whether complicated  Hot  flashes  Primary osteoarthritis of both knees  Mood swings  Screening for diabetes mellitus -     Hemoglobin A1c  Mood swing -     buPROPion (WELLBUTRIN XL) 300 MG 24 hr tablet; Take 1 tablet (300 mg total) by mouth daily. -     ALPRAZolam (XANAX) 0.5 MG tablet; Take 1 tablet (0.5 mg total) by mouth at bedtime as needed.  Menopause -     ALPRAZolam (XANAX) 0.5 MG tablet; Take 1 tablet (0.5 mg total) by mouth at bedtime as needed.  Hot flash, menopausal -     ALPRAZolam (XANAX) 0.5 MG tablet; Take 1 tablet (0.5 mg total) by mouth at bedtime as needed.  Irritability -     ALPRAZolam (XANAX) 0.5 MG tablet; Take 1 tablet (0.5 mg total) by mouth at bedtime as needed.  Insomnia -     ALPRAZolam (XANAX) 0.5 MG tablet; Take 1 tablet (0.5 mg total) by mouth at bedtime as needed.  Other orders -     risperiDONE (RISPERDAL) 0.5 MG tablet; Take 1 tablet (0.5 mg total) by mouth 2 (two) times daily.   Follow-up pending labs, yearly for physical

## 2018-05-26 ENCOUNTER — Encounter: Payer: Self-pay | Admitting: Medical

## 2018-05-26 ENCOUNTER — Other Ambulatory Visit: Payer: Self-pay | Admitting: Medical

## 2018-05-26 LAB — CBC
Hematocrit: 37.4 % (ref 34.0–46.6)
Hemoglobin: 12.9 g/dL (ref 11.1–15.9)
MCH: 32.6 pg (ref 26.6–33.0)
MCHC: 34.5 g/dL (ref 31.5–35.7)
MCV: 94 fL (ref 79–97)
Platelets: 189 10*3/uL (ref 150–450)
RBC: 3.96 x10E6/uL (ref 3.77–5.28)
RDW: 11.7 % — ABNORMAL LOW (ref 12.3–15.4)
WBC: 6.5 10*3/uL (ref 3.4–10.8)

## 2018-05-26 LAB — TSH: TSH: 1.75 u[IU]/mL (ref 0.450–4.500)

## 2018-05-26 LAB — LIPID PANEL
Chol/HDL Ratio: 2.5 ratio (ref 0.0–4.4)
Cholesterol, Total: 168 mg/dL (ref 100–199)
HDL: 67 mg/dL (ref >39)
LDL Calculated: 80 mg/dL (ref 0–99)
Triglycerides: 105 mg/dL (ref 0–149)
VLDL Cholesterol Cal: 21 mg/dL (ref 5–40)

## 2018-05-26 LAB — VITAMIN D 25 HYDROXY (VIT D DEFICIENCY, FRACTURES): Vit D, 25-Hydroxy: 29.3 ng/mL — ABNORMAL LOW (ref 30.0–100.0)

## 2018-05-26 LAB — HEMOGLOBIN A1C
Est. average glucose Bld gHb Est-mCnc: 103 mg/dL
Hgb A1c MFr Bld: 5.2 % (ref 4.8–5.6)

## 2018-05-26 MED ORDER — VITAMIN D 25 MCG (1000 UNIT) PO TABS: 1000.0000 [IU] | ORAL_TABLET | Freq: Every day | ORAL | 3 refills | Status: DC

## 2018-05-26 MED ORDER — ROSUVASTATIN CALCIUM 10 MG PO TABS: 10.0000 mg | ORAL_TABLET | Freq: Every day | ORAL | 3 refills | Status: DC

## 2018-06-02 LAB — HM MAMMOGRAPHY

## 2018-06-15 ENCOUNTER — Encounter: Payer: Self-pay | Admitting: Internal Medicine

## 2018-06-24 ENCOUNTER — Encounter: Payer: Self-pay | Admitting: Internal Medicine

## 2018-08-02 ENCOUNTER — Other Ambulatory Visit: Payer: Self-pay | Admitting: Medical

## 2018-08-02 ENCOUNTER — Telehealth: Payer: Self-pay | Admitting: Medical

## 2018-08-02 NOTE — Telephone Encounter (Signed)
She sent email.  Get in for f/u on new medication started last visit

## 2018-08-04 NOTE — Telephone Encounter (Signed)
Left message for pt that she needs a follow up

## 2018-08-23 ENCOUNTER — Other Ambulatory Visit: Payer: Self-pay | Admitting: Medical

## 2018-08-23 DIAGNOSIS — R4586 Emotional lability: Secondary | ICD-10-CM

## 2018-08-23 NOTE — Telephone Encounter (Signed)
Left message on voicemail for patient to call back. 

## 2018-08-25 ENCOUNTER — Ambulatory Visit: Payer: BLUE CROSS/BLUE SHIELD | Admitting: Medical

## 2018-08-25 ENCOUNTER — Encounter: Payer: Self-pay | Admitting: Medical

## 2018-08-25 VITALS — BP 120/76 | HR 54 | Temp 98.5°F | Resp 16 | Ht 68.0 in | Wt 199.6 lb

## 2018-08-25 DIAGNOSIS — F329 Major depressive disorder, single episode, unspecified: Secondary | ICD-10-CM

## 2018-08-25 DIAGNOSIS — R232 Flushing: Secondary | ICD-10-CM

## 2018-08-25 DIAGNOSIS — Z780 Asymptomatic menopausal state: Secondary | ICD-10-CM

## 2018-08-25 DIAGNOSIS — K743 Primary biliary cirrhosis: Secondary | ICD-10-CM

## 2018-08-25 DIAGNOSIS — R4586 Emotional lability: Secondary | ICD-10-CM

## 2018-08-25 DIAGNOSIS — F419 Anxiety disorder, unspecified: Secondary | ICD-10-CM | POA: Diagnosis not present

## 2018-08-25 DIAGNOSIS — R4589 Other symptoms and signs involving emotional state: Secondary | ICD-10-CM

## 2018-08-25 NOTE — Progress Notes (Signed)
Subjective: Chief Complaint  Patient presents with  . follow up medication    follow up medications needs refill   Here for med check.  Medical team: GI with Dr. Ulla Potash with Dr. Jonna Munro, Kermit Balo, PA-C here for primary care  Here for recheck on treatment for hot flashes, postmenopausal symptoms, anxiety in mood issues.  She currently is taking risperidone 0.5 mg daily (not BID as recommended last visit), Xanax 0.5 mg as needed, Wellbutrin 300 mg XL daily.  She is concerned that she has had some weight gain since starting the risperidone, has a voracious appetite.  Wants to know she can go back on the Phentermine to help lose weight.  Thinks we may need to increase one of the mood medications a little.  Exercise - walking and goes to the gym, usually at least 4 days per week.  Diet - voracious, constantly hungry  When she started the risperidone last visit, she had improvements of the hot flashes within 3 days.  She also felt like her mood and clarity of thought improved.  She is still taking her statin regularly.  Her gynecologist did not feel that HRT was a good idea due to risks.     Past Medical History:  Diagnosis Date  . 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 Anxiety 05/06/2016  . Arrhythmia 05/06/2016   During Child Birth Only  . Atrophic vaginitis    04/2013  . Cervical polyp 04/2013   Dr. Ernestina Penna, biopsied   . H/O mammogram 01/2013   normal  . Hemorrhoids 05/06/2016  . Hyperlipidemia 05/06/2016  . Menopausal state    started Citalopram 2014   . Primary biliary cirrhosis (HCC) 2014   Dr. Elnoria Howard  . Routine gynecological examination    Dr. Ernestina Penna  . Shoulder bursitis    left, prior steroidal shots at Urgent Care  . 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 ALPRAZolam (XANAX) 0.5 MG tablet Take 1 tablet (0.5 mg total) by mouth at bedtime as needed. 30  tablet 1  . buPROPion (WELLBUTRIN XL) 300 MG 24 hr tablet Take 1 tablet (300 mg total) by mouth daily. 30 tablet 2  . cholecalciferol (VITAMIN D3) 25 MCG (1000 UT) tablet Take 1 tablet (1,000 Units total) by mouth daily. 90 tablet 3  . ibuprofen (ADVIL,MOTRIN) 200 MG tablet Take 400 mg by mouth daily as needed (pain). Reported on 10/11/2015    . Multiple Vitamin (MULTIVITAMIN WITH MINERALS) TABS tablet Take 1 tablet by mouth at bedtime.    . risperiDONE (RISPERDAL) 0.5 MG tablet Take 1 tablet (0.5 mg total) by mouth 2 (two) times daily. 60 tablet 1  . rosuvastatin (CRESTOR) 10 MG tablet Take 1 tablet (10 mg total) by mouth at bedtime. 90 tablet 3  . ursodiol (ACTIGALL) 300 MG capsule Take 600 mg by mouth 2 (two) times daily.      No current facility-administered medications on file prior to visit.    Family History  Problem Relation Age of Onset  . Other Mother        accidental death  . Cancer Father        died of colon cancer age 21yo  . Heart disease Father        palpitations  . Colon cancer Father 107  . Cancer Brother        bone  .  Diabetes Maternal Grandmother   . Heart disease Maternal Grandmother   . Stroke Neg Hx   . Hypertension Neg Hx     ROS as in subjective    Objective: BP 120/76   Pulse (!) 54   Temp 98.5 F (36.9 C) (Oral)   Resp 16   Ht 5\' 8"  (1.727 m)   Wt 199 lb 9.6 oz (90.5 kg)   SpO2 99%   BMI 30.35 kg/m   Gen: wd,wn, nad Psych: pleasant good eye contact, answers questions approximately   Assessment: Encounter Diagnoses  Name Primary?  Marland Kitchen Hot flashes Yes  . Primary biliary cholangitis (HCC)   . Depressed mood   . Anxiety   . Post-menopausal   . Mood swings      Plan: We discussed symptoms, concerns, current medications.   We discussed that her biliary cholangitis limits dosing and choices with some medications.   HRT not likely an option given risks.    Regarding hot flashes, post menopausal symptoms, mood swings, anxiety - we have  tried Effexor, Paxil, Citalopram, clonidine and her current regimen with Wellbutrin, and Risperidone started last visit.  Although she sees improvement, still not quite improved to where she feels she needs to be.   She is also worried about weight gain and already increased appetite on Risperiodione.     I advised that I didn't feel comfortable with plain phentermine.  We discussed other weight loss medication options, but again we are limited by her liver issue.  Other options include Belviq, Saxenda, increasing Risperidone to BID.  I don't feel comfortable maxing out Wellbutrin to 450mg  dosing given liver concerns.  Reviewed most recent labs from 02/2018  I will discuss options with supervising physician.    Evona was seen today for follow up medication.  Diagnoses and all orders for this visit:  Hot flashes  Primary biliary cholangitis (HCC)  Depressed mood  Anxiety  Post-menopausal  Mood swings

## 2018-08-29 ENCOUNTER — Other Ambulatory Visit: Payer: Self-pay | Admitting: Medical

## 2018-08-29 DIAGNOSIS — R4586 Emotional lability: Secondary | ICD-10-CM

## 2018-08-30 NOTE — Telephone Encounter (Signed)
Is this ok to refill?  

## 2018-09-03 ENCOUNTER — Other Ambulatory Visit: Payer: Self-pay | Admitting: Medical

## 2018-09-03 MED ORDER — INSULIN PEN NEEDLE 32G X 4 MM MISC: 1.0000 | Freq: Every day | 11 refills | Status: DC

## 2018-09-03 MED ORDER — LIRAGLUTIDE -WEIGHT MANAGEMENT 18 MG/3ML ~~LOC~~ SOPN: PEN_INJECTOR | SUBCUTANEOUS | 1 refills | Status: DC

## 2018-09-06 ENCOUNTER — Other Ambulatory Visit: Payer: Self-pay | Admitting: Medical

## 2018-09-07 ENCOUNTER — Other Ambulatory Visit: Payer: Self-pay | Admitting: Medical

## 2018-09-07 NOTE — Telephone Encounter (Signed)
Is this ok to refill?  

## 2018-09-07 NOTE — Telephone Encounter (Signed)
See prior message

## 2018-09-08 ENCOUNTER — Other Ambulatory Visit: Payer: Self-pay | Admitting: Medical

## 2018-09-08 MED ORDER — "NEEDLE (DISP) 30G X 1/2"" MISC": 1.0000 | 2 refills | Status: DC

## 2018-09-08 MED ORDER — SEMAGLUTIDE(0.25 OR 0.5MG/DOS) 2 MG/1.5ML ~~LOC~~ SOPN: 0.2500 mg | PEN_INJECTOR | SUBCUTANEOUS | 1 refills | Status: DC

## 2018-09-10 NOTE — Progress Notes (Signed)
Office Visit Note  Patient: Michelle Mcmahon             Date of Birth: 07/15/1955           MRN: [id number]             PCP: Jac Canavan, PA-C Referring: Jac Canavan, PA-C Visit Date: 09/17/2018 Occupation: @  Subjective:  Pain in multiple joints   History of Present Illness: Michelle Mcmahon is a 63 y.o. female with history of myofascial pain and osteoarthritis.  She presents today with increased joint pain in multiple joints.  She is having pain and swelling in bilateral hands, bilateral feet, and left knee joint.  She wishes discomfort in both knees with the left knee is worse.  She denies any recent injuries or falls.  She continues to have myofascial pain.  She is trapezius muscle spasms and muscle tension bilaterally.  She continues to have trochanter bursitis bilaterally but her symptoms have been worsening.  She has not been performing stretching exercises on a regular basis.  She is also noticed increased joint stiffness.  Her fatigue has been worsening recently and she continues to have interrupted sleep at night due to the pain she has been experiencing.  Activities of Daily Living:  Patient reports morning stiffness for 5 minutes.   Patient Reports nocturnal pain.  Difficulty dressing/grooming: Reports Difficulty climbing stairs: Reports Difficulty getting out of chair: Reports Difficulty using hands for taps, buttons, cutlery, and/or writing: Reports  Review of Systems  Constitutional: Positive for fatigue.  HENT: Positive for mouth dryness. Negative for mouth sores and nose dryness.   Eyes: Negative for pain, itching, visual disturbance and dryness.  Respiratory: Negative for cough, hemoptysis, shortness of breath, wheezing and difficulty breathing.   Cardiovascular: Negative for chest pain, palpitations, hypertension and swelling in legs/feet.  Gastrointestinal: Negative for abdominal pain, blood in stool, constipation and diarrhea.  Endocrine: Negative for  increased urination.  Genitourinary: Negative for painful urination and pelvic pain.  Musculoskeletal: Positive for arthralgias, joint pain, joint swelling and morning stiffness. Negative for myalgias, muscle weakness, muscle tenderness and myalgias.  Skin: Negative for color change, pallor, rash, hair loss, nodules/bumps, redness, skin tightness, ulcers and sensitivity to sunlight.  Allergic/Immunologic: Negative for susceptible to infections.  Neurological: Positive for headaches. Negative for dizziness, light-headedness, numbness and memory loss.  Hematological: Negative for swollen glands.  Psychiatric/Behavioral: Positive for sleep disturbance. Negative for depressed mood and confusion. The patient is not nervous/anxious.     PMFS History:  Patient Active Problem List   Diagnosis Date Noted  . Post-menopausal 08/25/2018  . Mood swings 05/25/2018  . Vaccine counseling 05/20/2017  . Need for influenza vaccination 05/20/2017  . Screening for diabetes mellitus 05/20/2017  . Primary osteoarthritis of both knees 04/15/2017  . 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 Hot flashes 07/29/2016  . Depressed mood 07/29/2016  . Weight gain 07/29/2016  . Anxiety 05/06/2016  . Varicose vein of leg 05/06/2016  . Hemorrhoids 05/06/2016  . Hyperlipidemia 05/06/2016  . Myofascial pain 05/06/2016  . Osteoarthritis of both hands 05/06/2016  . Osteoarthritis of both feet 05/06/2016  . Vitamin D deficiency 05/06/2016  . Primary biliary cholangitis (HCC) 09/22/2013  . Atrophic vaginitis 09/22/2013    Past Medical History:  Diagnosis Date  . 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 Anxiety 05/06/2016  . Arrhythmia 05/06/2016   During Child Birth Only  . Atrophic vaginitis    04/2013  .  Cervical polyp 04/2013   Dr. Ernestina Penna, biopsied   . H/O mammogram 01/2013   normal  . Hemorrhoids 05/06/2016  . Hyperlipidemia 05/06/2016  . Menopausal state    started Citalopram  2014   . Primary biliary cirrhosis (HCC) 2014   Dr. Elnoria Howard  . Routine gynecological examination    Dr. Ernestina Penna  . Shoulder bursitis    left, prior steroidal shots at [facility]  . Varicose vein of leg 05/06/2016    Family History  Problem Relation Age of Onset  . Other Mother        accidental death  . Cancer Father        died of colon cancer age 75yo  . Heart disease Father        palpitations  . Colon cancer Father 2  . Cancer Brother        bone  . Diabetes Maternal Grandmother   . Heart disease Maternal Grandmother   . Stroke Neg Hx   . Hypertension Neg Hx    Past Surgical History:  Procedure Laterality Date  . CERVICAL BIOPSY  04/2013   Dr. Ernestina Penna, gynecology  . COLONOSCOPY     10/2016, prior in 2014, tubular adenoma, serrated adenoma, Dr. Elnoria Howard; repeat q3 years  . LIVER BIOPSY  05/2013   Social History   Social History Narrative   Married, 2 children, age 43yo and 76yo, 6 grandchildren, and she watches one of the 62 yo,  exercise - walk often, has [employer], homemaker.    05/2017   Immunization History  Administered Date(s) Administered  . Hepatitis B, adult 04/25/2013, 01/01/2015  . Influenza,inj,Quad PF,6+ Mos 05/20/2017, 05/25/2018  . Influenza-Unspecified 04/27/2013, 04/30/2016  . Tdap 01/30/2015     Objective: Vital Signs: BP 108/70 (BP Location: Left Arm, Patient Position: Sitting, Cuff Size: Normal)   Pulse 70   Resp 15   Ht 5\' 8"  (1.727 m)   Wt 200 lb 3.2 oz (90.8 kg)   BMI 30.44 kg/m    Physical Exam Vitals signs and nursing note reviewed.  Constitutional:      Appearance: She is well-developed.  HENT:     Head: Normocephalic and atraumatic.  Eyes:     Conjunctiva/sclera: Conjunctivae normal.  Neck:     Musculoskeletal: Normal range of motion.  Cardiovascular:     Rate and Rhythm: Normal rate and regular rhythm.     Heart sounds: Normal heart sounds.  Pulmonary:     Effort: Pulmonary effort is normal.     Breath sounds:  Normal breath sounds.  Abdominal:     General: Bowel sounds are normal.     Palpations: Abdomen is soft.  Lymphadenopathy:     Cervical: No cervical adenopathy.  Skin:    General: Skin is warm and dry.     Capillary Refill: Capillary refill takes less than 2 seconds.  Neurological:     Mental Status: She is alert and oriented to person, place, and time.  Psychiatric:        Behavior: Behavior normal.      Musculoskeletal Exam: C-spine, thoracic spine, spine good range of motion.  No midline spinal tenderness.  No SI joint tenderness.  Trapezius muscle spasms bilaterally.  Shoulder joints, elbow joints, wrist joints, MCPs and PIPs, DIPs good range of motion no synovitis.  She has PIP and DIP synovial thickening consistent with osteoarthritis of bilateral hands.  She has complete fist formation bilaterally.  Hip joints, knee joints, ankle joints, MTPs, PIPs, DIPs good  range of motion with no synovitis.  No warmth or effusion bilateral knee joints.  No tenderness or swelling ankle joints.  She has tenderness on the plantar aspect of bilateral heels.  She has PIP and DIP synovial thickening consistent with osteoarthritis of bilateral feet.  Tenderness over bilateral trochanteric bursa.  CDAI Exam: CDAI Score: Not documented Patient Global Assessment: Not documented; Provider Global Assessment: Not documented Swollen: Not documented; Tender: Not documented Joint Exam   Not documented   There is currently no information documented on the homunculus. Go to the Rheumatology activity and complete the homunculus joint exam.  Investigation: No additional findings.  Imaging: Xr Foot 2 Views Left  Result Date: 09/17/2018 First MTP PIP and DIP narrowing was noted.  No erosive changes were noted.  No intertarsal joint space narrowing was noted.  No tibiotalar or subtalar joint space narrowing was noted. Impression: These findings are consistent with osteoarthritis of the foot.  Xr Foot 2 Views  Right  Result Date: 09/17/2018 First MTP PIP and DIP narrowing was noted.  No erosive changes were noted.  No intertarsal joint space narrowing was noted.  No tibiotalar or subtalar joint space narrowing was noted. Impression: These findings are consistent with osteoarthritis of the foot.  Xr Hand 2 View Left  Result Date: 09/17/2018 PIP and DIP narrowing was noted.  No MCP intercarpal radiocarpal joint space narrowing was noted.  No erosive changes were noted. Impression: These findings are consistent with osteoarthritis of the hand.  Xr Hand 2 View Right  Result Date: 09/17/2018 PIP and DIP narrowing was noted.  No MCP intercarpal radiocarpal joint space narrowing was noted.  No erosive changes were noted. Impression: These findings are consistent with osteoarthritis of the hand.  Xr Knee 3 View Left  Result Date: 09/17/2018 Mild lateral compartment narrowing was noted.  No chondrocalcinosis was noted.  Moderate patellofemoral narrowing was noted. Impression: These findings are consistent with mild osteoarthritis and moderate chondromalacia patella.  Xr Knee 3 View Right  Result Date: 09/17/2018 Mild lateral compartment narrowing was noted.  No chondrocalcinosis was noted.  Moderate patellofemoral narrowing was noted. Impression: These findings are consistent with mild osteoarthritis and moderate chondromalacia patella.   Recent Labs: Lab Results  Component Value Date   WBC 6.5 05/25/2018   HGB 12.9 05/25/2018   PLT 189 05/25/2018   NA 137 04/15/2017   K 4.7 04/15/2017   CL 103 04/15/2017   CO2 27 04/15/2017   GLUCOSE 76 04/15/2017   BUN 13 04/15/2017   CREATININE 0.77 04/15/2017   BILITOT 0.7 04/15/2017   ALKPHOS 101 03/26/2016   AST 23 04/15/2017   ALT 19 04/15/2017   PROT 8.1 04/15/2017   ALBUMIN 4.2 03/26/2016   CALCIUM 9.4 04/15/2017   GFRAA 97 04/15/2017    Speciality Comments: No specialty comments available.  Procedures:  No procedures performed Allergies:  Citalopram and Effexor [venlafaxine]   Assessment / Plan:     Visit Diagnoses: Primary osteoarthritis of both hands: She has PIP and DIP synovial thickening consistent with osteoarthritis of bilateral hands.  No synovitis was noted on exam.  She has complete fist formation bilaterally.  X-rays of both hands were obtained today.  Joint protection and muscle strengthening were discussed.  A list of natural anti-inflammatories was reviewed with the patient today.  Pain in both hands -She presents today with increased pain in bilateral hands.  She has a history of intermittent joint swelling but no synovitis was noted on exam today.  She had ultrasound of both hands on 05/07/2016 that did not reveal any synovitis.  X-rays of both hands were obtained today.X-rays revealed findings consistent with osteoarthritis of bilateral hands.  No erosive changes were noted.  We will also obtain the following lab work.  Plan: XR Hand 2 View Left, XR Hand 2 View Right, 14-3-3 eta Protein, Cyclic citrul peptide antibody, IgG, Rheumatoid factor, Sedimentation rate  Trochanteric bursitis of both hips: She has tenderness over bilateral trochanter bursa.  Her symptoms have been worsening recently.  She was given a handout of stretching exercises that she can perform.  Primary osteoarthritis of both knees: No warmth or effusion.  She has good range of motion.  She is been having increased discomfort in bilateral knee joints especially the left knee.  She has not had any recent injuries or falls.  She has no mechanical symptoms at this time.  X-rays of both knees were obtained today.  Chronic pain of both knees -She has been experiencing increased pain in bilateral knee joints.  No warmth or effusion was noted.  She has good range of motion on exam.  She has noticed intermittent joint swelling in the left knee joint.  X-rays of both knee joints were obtained today.  X-rays were consistent with osteoarthritis and moderate  chondromalacia patella bilaterally.  Plan: XR KNEE 3 VIEW RIGHT, XR KNEE 3 VIEW LEFT   Chondromalacia of both patellae  Primary osteoarthritis of both feet: She has PIP and DIP synovial thickening consistent with osteoarthritis of bilateral feet.  No synovitis noted. She has been having increased discomfort in both feet.  X-rays of both feet were obtained today.  X-rays of both feet were consistent with osteoarthritis.  No erosive changes were noted.  Pain in both feet -She has been having increased pain in bilateral feet.  She has no synovitis on exam.  She has PIP and DIP synovial thickening consistent with osteoarthritis of bilateral feet.  She has also having tenderness on the plantar aspect of bilateral heels.  X-rays of both feet were obtained today.   plan: XR Foot 2 Views Left, XR Foot 2 Views Right  Myofascial pain: She has generalized hyperalgesia and positive tender points on exam.  She is trapezius muscle spasm and muscle tension bilaterally.  She has bilateral trochanter bursitis.  She was given a handout of exercises to perform.  She continues have chronic fatigue and insomnia.  She has interrupted sleep at night due to discomfort she experiences.  We discussed the importance of regular exercise and good sleep hygiene.  Vitamin D deficiency: She is taking a vitamin D supplement.  Primary biliary cholangitis (HCC)   Orders: Orders Placed This Encounter  Procedures  . XR Hand 2 View Left  . XR Hand 2 View Right  . XR Foot 2 Views Left  . XR Foot 2 Views Right  . XR KNEE 3 VIEW RIGHT  . XR KNEE 3 VIEW LEFT  . 14-3-3 eta Protein  . Cyclic citrul peptide antibody, IgG  . Rheumatoid factor  . Sedimentation rate   No orders of the defined types were placed in this encounter.   Face-to-face time spent with patient was 30 minutes. Greater than 50% of time was spent in counseling and coordination of care.  Follow-Up Instructions: Return in about 4 weeks (around 10/15/2018) for  Osteoarthritis, Myofascial pain.   Sherron Ales, PA-C  I examined and evaluated the patient with Sherron Ales PA.  Patient complains of pain in multiple  joints today.  She is concerned that she is positive family history of autoimmune disease in her family.  I did not appreciate any synovitis on examination.  We obtained x-rays today which were consistent with mild osteoarthritis in her hands and feet.  Knee joints are consistent with chondromalacia patella.  We will obtain labs today.  The plan of care was discussed as noted above.  Pollyann Savoy, MD  Note - This record has been created using Animal nutritionist.  Chart creation errors have been sought, but may not always  have been located. Such creation errors do not reflect on  the standard of medical care.

## 2018-09-13 ENCOUNTER — Telehealth: Payer: Self-pay | Admitting: Medical

## 2018-09-13 NOTE — Telephone Encounter (Signed)
P.A. SAXENDA  

## 2018-09-17 ENCOUNTER — Other Ambulatory Visit: Payer: Self-pay

## 2018-09-17 ENCOUNTER — Encounter: Payer: Self-pay | Admitting: Rheumatology

## 2018-09-17 ENCOUNTER — Ambulatory Visit (INDEPENDENT_AMBULATORY_CARE_PROVIDER_SITE_OTHER): Payer: BLUE CROSS/BLUE SHIELD

## 2018-09-17 ENCOUNTER — Ambulatory Visit (INDEPENDENT_AMBULATORY_CARE_PROVIDER_SITE_OTHER): Payer: Self-pay

## 2018-09-17 ENCOUNTER — Ambulatory Visit: Payer: BLUE CROSS/BLUE SHIELD | Admitting: Rheumatology

## 2018-09-17 VITALS — BP 108/70 | HR 70 | Resp 15 | Ht 68.0 in | Wt 200.2 lb

## 2018-09-17 DIAGNOSIS — M19041 Primary osteoarthritis, right hand: Secondary | ICD-10-CM

## 2018-09-17 DIAGNOSIS — M2241 Chondromalacia patellae, right knee: Secondary | ICD-10-CM

## 2018-09-17 DIAGNOSIS — M79672 Pain in left foot: Secondary | ICD-10-CM

## 2018-09-17 DIAGNOSIS — M7062 Trochanteric bursitis, left hip: Secondary | ICD-10-CM

## 2018-09-17 DIAGNOSIS — K743 Primary biliary cirrhosis: Secondary | ICD-10-CM

## 2018-09-17 DIAGNOSIS — M79671 Pain in right foot: Secondary | ICD-10-CM

## 2018-09-17 DIAGNOSIS — M79642 Pain in left hand: Secondary | ICD-10-CM

## 2018-09-17 DIAGNOSIS — M170 Bilateral primary osteoarthritis of knee: Secondary | ICD-10-CM

## 2018-09-17 DIAGNOSIS — M7061 Trochanteric bursitis, right hip: Secondary | ICD-10-CM

## 2018-09-17 DIAGNOSIS — G8929 Other chronic pain: Secondary | ICD-10-CM

## 2018-09-17 DIAGNOSIS — M25561 Pain in right knee: Secondary | ICD-10-CM

## 2018-09-17 DIAGNOSIS — M25562 Pain in left knee: Secondary | ICD-10-CM

## 2018-09-17 DIAGNOSIS — M19042 Primary osteoarthritis, left hand: Secondary | ICD-10-CM

## 2018-09-17 DIAGNOSIS — M2242 Chondromalacia patellae, left knee: Secondary | ICD-10-CM

## 2018-09-17 DIAGNOSIS — M19072 Primary osteoarthritis, left ankle and foot: Secondary | ICD-10-CM

## 2018-09-17 DIAGNOSIS — E559 Vitamin D deficiency, unspecified: Secondary | ICD-10-CM

## 2018-09-17 DIAGNOSIS — M79641 Pain in right hand: Secondary | ICD-10-CM

## 2018-09-17 DIAGNOSIS — M19071 Primary osteoarthritis, right ankle and foot: Secondary | ICD-10-CM

## 2018-09-17 DIAGNOSIS — M7918 Myalgia, other site: Secondary | ICD-10-CM

## 2018-09-17 NOTE — Patient Instructions (Signed)

## 2018-09-20 ENCOUNTER — Other Ambulatory Visit: Payer: Self-pay | Admitting: Medical

## 2018-09-20 MED ORDER — RISPERIDONE 0.5 MG PO TABS: 0.5000 mg | ORAL_TABLET | Freq: Two times a day (BID) | ORAL | 1 refills | Status: DC

## 2018-09-20 NOTE — Telephone Encounter (Signed)
Was able to get approval for Saxenda and activated discount card.  Called pharmacy had then rerun Rx and with discount card brought cost down to $24.99.  The Ozempic was $500 so she should go with the Saxenda.  Left message for pt

## 2018-09-21 LAB — RHEUMATOID FACTOR: Rheumatoid fact SerPl-aCnc: <14 [IU]/mL

## 2018-09-21 LAB — CYCLIC CITRUL PEPTIDE ANTIBODY, IGG: Cyclic Citrullin Peptide Ab: <16 UNITS

## 2018-09-21 LAB — SEDIMENTATION RATE: Sed Rate: 36 mm/h — ABNORMAL HIGH (ref 0–30)

## 2018-09-21 LAB — 14-3-3 ETA PROTEIN: 14-3-3 eta Protein: <0.2 ng/mL (ref <0.2)

## 2018-09-21 NOTE — Telephone Encounter (Signed)
Pt informed

## 2018-09-21 NOTE — Progress Notes (Signed)
CCP is negative.

## 2018-09-21 NOTE — Progress Notes (Signed)
Sed rate is mildly elevated but stable.  14-3-3 eta is negative. RF is negative.

## 2018-10-07 ENCOUNTER — Ambulatory Visit: Payer: BLUE CROSS/BLUE SHIELD | Admitting: Rheumatology

## 2018-11-22 ENCOUNTER — Other Ambulatory Visit: Payer: Self-pay | Admitting: Medical

## 2018-11-22 NOTE — Telephone Encounter (Signed)
Is this okay to refill? 

## 2018-12-31 ENCOUNTER — Other Ambulatory Visit: Payer: Self-pay | Admitting: Medical

## 2018-12-31 NOTE — Telephone Encounter (Signed)
Is this okay to refill? 

## 2019-01-03 NOTE — Telephone Encounter (Signed)
I received refill request on Saxenda.  If she is still using this, then we need to do virtual consult on weight loss efforts.  She may be due for up on liver tests any how.

## 2019-01-04 NOTE — Telephone Encounter (Signed)
Left message on voicemail for patient to call back. 

## 2019-01-04 NOTE — Telephone Encounter (Signed)
Patient states that her liver doctor already has checked her liver and she is still on the saxenda

## 2019-01-04 NOTE — Telephone Encounter (Signed)
I don't think we have done a follow up since starting Saxenda, so please set up virtual or in office folllow up on weight loss.  I need an updated accurate weight

## 2019-01-04 NOTE — Telephone Encounter (Signed)
Patient scheduled appt 01-06-19 at 930

## 2019-01-06 ENCOUNTER — Ambulatory Visit (INDEPENDENT_AMBULATORY_CARE_PROVIDER_SITE_OTHER): Payer: BC Managed Care – PPO | Admitting: Medical

## 2019-01-06 ENCOUNTER — Encounter: Payer: Self-pay | Admitting: Medical

## 2019-01-06 ENCOUNTER — Other Ambulatory Visit: Payer: Self-pay

## 2019-01-06 ENCOUNTER — Other Ambulatory Visit: Payer: Self-pay | Admitting: Medical

## 2019-01-06 VITALS — Ht 68.0 in | Wt 193.0 lb

## 2019-01-06 DIAGNOSIS — Z780 Asymptomatic menopausal state: Secondary | ICD-10-CM | POA: Diagnosis not present

## 2019-01-06 DIAGNOSIS — R635 Abnormal weight gain: Secondary | ICD-10-CM | POA: Diagnosis not present

## 2019-01-06 DIAGNOSIS — M19041 Primary osteoarthritis, right hand: Secondary | ICD-10-CM | POA: Diagnosis not present

## 2019-01-06 DIAGNOSIS — E785 Hyperlipidemia, unspecified: Secondary | ICD-10-CM

## 2019-01-06 DIAGNOSIS — Z6828 Body mass index (BMI) 28.0-28.9, adult: Secondary | ICD-10-CM

## 2019-01-06 DIAGNOSIS — Z79899 Other long term (current) drug therapy: Secondary | ICD-10-CM

## 2019-01-06 DIAGNOSIS — K743 Primary biliary cirrhosis: Secondary | ICD-10-CM

## 2019-01-06 DIAGNOSIS — M19042 Primary osteoarthritis, left hand: Secondary | ICD-10-CM

## 2019-01-06 MED ORDER — SAXENDA 18 MG/3ML ~~LOC~~ SOPN: 3.0000 mg | PEN_INJECTOR | Freq: Every day | SUBCUTANEOUS | 6 refills | Status: DC

## 2019-01-06 MED ORDER — BD PEN NEEDLE NANO U/F 32G X 4 MM MISC: 1.0000 | Freq: Every day | 11 refills | Status: DC

## 2019-01-06 NOTE — Progress Notes (Addendum)
This visit type was conducted due to national recommendations for restrictions regarding the COVID-19 Pandemic (e.g. social distancing) in an effort to limit this patient's exposure and mitigate transmission in our community.  Due to their co-morbid illnesses, this patient is at least at moderate risk for complications without adequate follow up.  This format is felt to be most appropriate for this patient at this time.    Documentation for virtual audio and video telecommunications through Zoom encounter:  The patient was located at home. The provider was located in the office. The patient did consent to this visit and is aware of possible charges through their insurance for this visit.  The other persons participating in this telemedicine service were none. Time spent on call was 15 minutes and in review of previous records >15 minutes total.  This virtual service is not related to other E/M service within previous 7 days.   Subjective: Chief Complaint  Patient presents with  . weight loss    weight    Here for virtual visit for weight loss medication.  I last saw her back in February.  At that time she want to use a medication to help her weight management.  She has struggled with trying to lose weight for months.  Given her liver issues there are not a lot of good options for weight loss medications.  She notes several attempts through healthy diet and exercise but just cannot seem to get those last few pounds off.  Since last visit she has titrated up to 3 mg of Saxenda and is doing fine on this.  She denies any major issues with the medication she has had some mild nausea but not bad.  She has no concerns with the device.  No side effects otherwise.  With the [place] stay at home measures she has not been able to exercise as much as she would like.  Her diet could be better   she is also limited by the fact that she takes care of her 31-year-old grandson.  No other aggravating or relieving  factors. No other complaint.  Past Medical History:  Diagnosis Date  . 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 Anxiety 05/06/2016  . Arrhythmia 05/06/2016   During Child Birth Only  . Atrophic vaginitis    04/2013  . Cervical polyp 04/2013   Dr. Pamala Hurry, biopsied   . H/O mammogram 01/2013   normal  . Hemorrhoids 05/06/2016  . Hyperlipidemia 05/06/2016  . Menopausal state    started Citalopram 2014   . Primary biliary cirrhosis (HCC) 2014   Dr. Benson Norway  . Routine gynecological examination    Dr. Pamala Hurry  . Shoulder bursitis    left, prior steroidal shots at Urgent Care  . 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 ALPRAZolam (XANAX) 0.5 MG tablet Take 1 tablet (0.5 mg total) by mouth at bedtime as needed. 30 tablet 1  . buPROPion (WELLBUTRIN XL) 300 MG 24 hr tablet TAKE ONE TABLET BY MOUTH DAILY 90 tablet 1  . cholecalciferol (VITAMIN D3) 25 MCG (1000 UT) tablet Take 1 tablet (1,000 Units total) by mouth daily. 90 tablet 3  . ibuprofen (ADVIL,MOTRIN) 200 MG tablet Take 400 mg by mouth daily as needed (pain). Reported on 10/11/2015    . Multiple Vitamin (MULTIVITAMIN WITH MINERALS) TABS tablet Take 1 tablet by mouth at bedtime.    Marland Kitchen  risperiDONE (RISPERDAL) 0.5 MG tablet TAKE ONE TABLET BY MOUTH TWICE A DAY 60 tablet 2  . Thiamine HCl (VITAMIN B-1 PO) Take by mouth daily.    . ursodiol (ACTIGALL) 300 MG capsule Take 600 mg by mouth 2 (two) times daily.     Marland Kitchen. NEEDLE, DISP, 30 G (BD ECLIPSE NEEDLE) 30G X 1/2" MISC 1 each by Does not apply route once a week. (Patient not taking: Reported on 09/17/2018) 5 each 2  . rosuvastatin (CRESTOR) 10 MG tablet Take 1 tablet (10 mg total) by mouth at bedtime. (Patient not taking: Reported on 09/17/2018) 90 tablet 3  . Semaglutide,0.25 or 0.5MG /DOS, (OZEMPIC, 0.25 OR 0.5 MG/DOSE,) 2 MG/1.5ML SOPN Inject 0.25 mg into the skin once a week. (Patient not  taking: Reported on 09/17/2018) 1.5 mL 1   No current facility-administered medications on file prior to visit.    ROS as in subjective    Objective Ht 5\' 8"  (1.727 m)   Wt 193 lb (87.5 kg)   BMI 29.35 kg/m    Wt Readings from Last 3 Encounters:  01/06/19 193 lb (87.5 kg)  09/17/18 200 lb 3.2 oz (90.8 kg)  08/25/18 199 lb 9.6 oz (90.5 kg)   Gen: wd, wn, nad Otherwise not examined given this is a virtual visit   Assessment: Encounter Diagnoses  Name Primary?  Marland Kitchen. BMI 28.0-28.9,adult Yes  . Hyperlipidemia, unspecified hyperlipidemia type   . Primary osteoarthritis of both hands   . Weight gain   . Post-menopausal       Plan: We discussed her concerns.  She has lost about 7 pounds.  I reiterated the need to get a little bit more discipline with her diet and we discussed some strategies where she could still get better control over her diet.  I recommend she get outside and exercise in the early morning or late evening when it is cooler, we discussed several dietary strategies even in light of the COVID restrictions.  We discussed proper use of the Saxenda pen device.  We discussed low-carb diet.  I advise she come in soon for CMET lab within next 2-3 weeks at her convenience.    [name] was seen today for weight loss.  Diagnoses and all orders for this visit:  BMI 28.0-28.9,adult  Hyperlipidemia, unspecified hyperlipidemia type  Primary osteoarthritis of both hands  Weight gain  Post-menopausal

## 2019-02-11 ENCOUNTER — Telehealth: Payer: Self-pay | Admitting: Medical

## 2019-02-11 NOTE — Telephone Encounter (Signed)
P.A. SAXENDA  Renewal completed

## 2019-02-12 NOTE — Telephone Encounter (Signed)
P.A. approved til 01/12/20, pt informed, faxed pharmacy

## 2019-02-18 ENCOUNTER — Other Ambulatory Visit: Payer: Self-pay | Admitting: Medical

## 2019-02-18 DIAGNOSIS — R4586 Emotional lability: Secondary | ICD-10-CM

## 2019-02-18 NOTE — Telephone Encounter (Signed)
I am putting this back in your lap

## 2019-02-18 NOTE — Telephone Encounter (Signed)
Is this ok to refill?  Michelle Mcmahon's patient. 

## 2019-02-21 NOTE — Telephone Encounter (Signed)
yours

## 2019-03-23 ENCOUNTER — Other Ambulatory Visit: Payer: Self-pay | Admitting: Medical

## 2019-05-19 ENCOUNTER — Other Ambulatory Visit: Payer: Self-pay | Admitting: Medical

## 2019-05-19 DIAGNOSIS — R4586 Emotional lability: Secondary | ICD-10-CM

## 2019-06-21 ENCOUNTER — Encounter: Payer: Self-pay | Admitting: Medical

## 2019-06-21 ENCOUNTER — Ambulatory Visit: Payer: BC Managed Care – PPO | Admitting: Medical

## 2019-06-21 ENCOUNTER — Other Ambulatory Visit: Payer: Self-pay

## 2019-06-21 VITALS — Ht 68.5 in | Wt 187.0 lb

## 2019-06-21 DIAGNOSIS — F321 Major depressive disorder, single episode, moderate: Secondary | ICD-10-CM

## 2019-06-21 DIAGNOSIS — Z6828 Body mass index (BMI) 28.0-28.9, adult: Secondary | ICD-10-CM

## 2019-06-21 DIAGNOSIS — Z7689 Persons encountering health services in other specified circumstances: Secondary | ICD-10-CM

## 2019-06-21 DIAGNOSIS — R635 Abnormal weight gain: Secondary | ICD-10-CM | POA: Diagnosis not present

## 2019-06-21 DIAGNOSIS — K743 Primary biliary cirrhosis: Secondary | ICD-10-CM | POA: Diagnosis not present

## 2019-06-21 MED ORDER — BUPROPION HCL ER (XL) 150 MG PO TB24: 150.0000 mg | ORAL_TABLET | Freq: Every day | ORAL | 0 refills | Status: DC

## 2019-06-21 MED ORDER — VORTIOXETINE HBR 10 MG PO TABS: 10.0000 mg | ORAL_TABLET | Freq: Every day | ORAL | 1 refills | Status: DC

## 2019-06-21 NOTE — Progress Notes (Signed)
Subjective:     Patient ID: Michelle Mcmahon, female   DOB: 09/03/1955, 63 y.o.   MRN: [id number]  This visit type was conducted due to national recommendations for restrictions regarding the COVID-19 Pandemic (e.g. social distancing) in an effort to limit this patient's exposure and mitigate transmission in our community.  Due to their co-morbid illnesses, this patient is at least at moderate risk for complications without adequate follow up.  This format is felt to be most appropriate for this patient at this time.    Documentation for virtual audio and video telecommunications through Zoom encounter:  The patient was located at home. The provider was located in the office. The patient did consent to this visit and is aware of possible charges through their insurance for this visit.  The other persons participating in this telemedicine service were none. Time spent on call was 20 minutes and in review of previous records 20 minutes total.  This virtual service is not related to other E/M service within previous 7 days.   HPI Chief Complaint  Patient presents with  . Medication Management    dicuss risperdal   Consult today to discuss mood.  Lately feeling a little more depressed.  Not motivated to do much lately.  Not motivated to decorate the Christmas tree.  No specific trigger.  She notes that she does not have a lot to be depressed about although she feels that way.  Marriage is good, finances are fine.  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  She does not have motivation for hobbies as she did in the past.  She denies HI/SI.  Although she has had some benefit on Wellbutrin and risperidone, she wonders if the dose could be adjusted.  She drinks limited alcohol.  She does some walking for exercise.  No particular changes in her way of life that would trigger her mood that she knows of.  Just feeling more down in general.  She continues on Saxenda for weight  management, seems to do fine on this.  she recently saw her liver doctor for follow-up.  Things are stable and they advised that she could follow-up again in a couple years   Review of Systems As in subjective  Past Medical History:  Diagnosis Date  . 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 Anxiety 05/06/2016  . Arrhythmia 05/06/2016   During Child Birth Only  . Atrophic vaginitis    04/2013  . Cervical polyp 04/2013   Dr. Pamala Hurry, biopsied   . H/O mammogram 01/2013   normal  . Hemorrhoids 05/06/2016  . Hyperlipidemia 05/06/2016  . Menopausal state    started Citalopram 2014   . Primary biliary cirrhosis (HCC) 2014   Dr. Benson Norway  . Routine gynecological examination    Dr. Pamala Hurry  . Shoulder bursitis    left, prior steroidal shots at Urgent Care  . 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 ALPRAZolam (XANAX) 0.5 MG tablet Take 1 tablet (0.5 mg total) by mouth at bedtime as needed. 30 tablet 1  . cholecalciferol (VITAMIN D3) 25 MCG (1000 UT) tablet Take 1 tablet (1,000 Units total) by mouth daily. 90 tablet 3  . ibuprofen (ADVIL,MOTRIN) 200 MG tablet Take 400 mg by mouth daily as needed (pain). Reported on 10/11/2015    . Insulin Pen Needle (BD PEN NEEDLE NANO U/F) 32G X  4 MM MISC 1 each by Does not apply route at bedtime. 100 each 11  . Liraglutide -Weight Management (SAXENDA) 18 MG/3ML SOPN Inject 3 mg into the skin daily. 3 mL 6  . Multiple Vitamin (MULTIVITAMIN WITH MINERALS) TABS tablet Take 1 tablet by mouth at bedtime.    . risperiDONE (RISPERDAL) 0.5 MG tablet TAKE ONE TABLET BY MOUTH TWICE A DAY 60 tablet 2  . Thiamine HCl (VITAMIN B-1 PO) Take by mouth daily.    . ursodiol (ACTIGALL) 300 MG capsule Take 600 mg by mouth 2 (two) times daily.     Marland Kitchen NEEDLE, DISP, 30 G (BD ECLIPSE NEEDLE) 30G X 1/2" MISC 1 each by Does not apply route once a week. (Patient not taking:  Reported on 09/17/2018) 5 each 2  . Semaglutide,0.25 or 0.5MG /DOS, (OZEMPIC, 0.25 OR 0.5 MG/DOSE,) 2 MG/1.5ML SOPN Inject 0.25 mg into the skin once a week. (Patient not taking: Reported on 09/17/2018) 1.5 mL 1   No current facility-administered medications on file prior to visit.   ROS as in subjective     Objective:   Physical Exam  Ht 5' 8.5" (1.74 m)   Wt 187 lb (84.8 kg)   BMI 28.02 kg/m   Due to coronavirus pandemic stay at home measures, patient visit was virtual and they were not examined in person.       Assessment:     Encounter Diagnoses  Name Primary?  . Depression, major, single episode, moderate (HCC) Yes  . Primary biliary cholangitis (HCC)   . Weight gain   . BMI 28.0-28.9,adult   . Encounter for weight management        Plan:     We discussed her concerns for depression.  Advised that she must follow-up with counseling as this has to be part of her treatment going forward.  Begin trial of low-dose Trintellix.  We will cut back on Wellbutrin and risperidone doses for now.  She will use Risperdal 0.5 mg once daily and Wellbutrin 150 mg daily for now.  If not much improved in the coming weeks on either 5 or 10 mg Trintellix then she will need to establish with psychiatry.  We will give her 5 mg sample Trintellix to start with.  Follow-up in 2 weeks, sooner as needed  Weight management, weight gain-continue Saxenda  Primary biliary cholangitis-she recently saw her liver doctor and things are stable per her report.  I do not have these records.  She notes that they advise follow-up in 2022   Shetara was seen today for medication management.  Diagnoses and all orders for this visit:  Depression, major, single episode, moderate (HCC)  Primary biliary cholangitis (HCC)  Weight gain  BMI 28.0-28.9,adult  Encounter for weight management  Other orders -     buPROPion (WELLBUTRIN XL) 150 MG 24 hr tablet; Take 1 tablet (150 mg total) by mouth daily. -      vortioxetine HBr (TRINTELLIX) 10 MG TABS tablet; Take 1 tablet (10 mg total) by mouth daily.

## 2019-06-21 NOTE — Progress Notes (Signed)
Called pt and she is going to come get sample

## 2019-06-21 NOTE — Patient Instructions (Addendum)
Recommendations:  Begin samples of Trintellix 5mg  daily for the next 2 weeks.  After 2 weeks if no side effects or problems, you can increase to 10mg  Trintellix.  Decrease your Wellubtrin dose.  I sent the lower dose 150mg  XL Wellbutrin to the pharmacy  Decrease your Risperidone dose by taking 1 daily instead of twice daily  I would like you to establish with counseling NOW!  I have listed some counselors below.   I recommend starting with [facility].  Plan to follow up with me in 2 weeks.     RESOURCES in [place], [place]  If you are experiencing a mental health crisis or an emergency, please call 911 or go to the nearest emergency department.  [facility]   [phone] [facility]  [phone] [facility]   [phone]  Suicide Hotline 1-800-Suicide 430 414 1267)  National Suicide Prevention Lifeline (820) 689-0233  (234)087-5901)  Domestic Violence, Rape/Crisis - [facility] [place] 734-025-1693  The [employer] Violence Hotline 1-800-799-SAFE 770-124-3748)  To report Child or Elder Abuse, please call: [facility] Police Department  [phone] [facility] Department  [phone]      Psychiatry and Counseling services   Dr. Launa Flight [address], [place], [place] [address] [phone]   [place], [place], [place] [address] [phone]       Counseling Services (NON- psychiatrist offices) Dr. Cristopher Estimable [phone] [place], [place] [address]   [facility] [address] [place], [place] [address] [phone]   Verneda Skill. Clarene Reamer, therapist [phone] [address] [place], [place] [address]   [facility] [phone] [address], [place],  [address]

## 2019-06-24 ENCOUNTER — Telehealth: Payer: Self-pay

## 2019-06-24 NOTE — Telephone Encounter (Signed)
Good, can we let her know

## 2019-06-24 NOTE — Telephone Encounter (Signed)
Pt. Aware that PA has been completed.

## 2019-06-24 NOTE — Telephone Encounter (Signed)
I submitted a PA for the pts. Trintel[employer] and it has been approved from 06/24/19-06/22/22.

## 2019-07-06 ENCOUNTER — Encounter: Payer: Self-pay | Admitting: Medical

## 2019-07-06 LAB — HM PAP SMEAR: HM Pap smear: NEGATIVE

## 2019-07-06 LAB — HM MAMMOGRAPHY

## 2019-07-13 ENCOUNTER — Other Ambulatory Visit: Payer: Self-pay

## 2019-07-13 ENCOUNTER — Ambulatory Visit: Payer: 59 | Admitting: Medical

## 2019-07-13 ENCOUNTER — Encounter: Payer: Self-pay | Admitting: Medical

## 2019-07-13 VITALS — BP 112/88 | HR 68 | Temp 98.0°F | Ht 69.0 in | Wt 194.6 lb

## 2019-07-13 DIAGNOSIS — Z7185 Encounter for immunization safety counseling: Secondary | ICD-10-CM

## 2019-07-13 DIAGNOSIS — Z6828 Body mass index (BMI) 28.0-28.9, adult: Secondary | ICD-10-CM

## 2019-07-13 DIAGNOSIS — G4700 Insomnia, unspecified: Secondary | ICD-10-CM

## 2019-07-13 DIAGNOSIS — Z0000 Encounter for general adult medical examination without abnormal findings: Secondary | ICD-10-CM | POA: Diagnosis not present

## 2019-07-13 DIAGNOSIS — F321 Major depressive disorder, single episode, moderate: Secondary | ICD-10-CM

## 2019-07-13 DIAGNOSIS — R232 Flushing: Secondary | ICD-10-CM

## 2019-07-13 DIAGNOSIS — K743 Primary biliary cirrhosis: Secondary | ICD-10-CM

## 2019-07-13 DIAGNOSIS — E785 Hyperlipidemia, unspecified: Secondary | ICD-10-CM

## 2019-07-13 DIAGNOSIS — M19041 Primary osteoarthritis, right hand: Secondary | ICD-10-CM

## 2019-07-13 DIAGNOSIS — E559 Vitamin D deficiency, unspecified: Secondary | ICD-10-CM

## 2019-07-13 DIAGNOSIS — M19072 Primary osteoarthritis, left ankle and foot: Secondary | ICD-10-CM

## 2019-07-13 DIAGNOSIS — Z780 Asymptomatic menopausal state: Secondary | ICD-10-CM

## 2019-07-13 DIAGNOSIS — B351 Tinea unguium: Secondary | ICD-10-CM

## 2019-07-13 DIAGNOSIS — M19071 Primary osteoarthritis, right ankle and foot: Secondary | ICD-10-CM

## 2019-07-13 DIAGNOSIS — N951 Menopausal and female climacteric states: Secondary | ICD-10-CM

## 2019-07-13 DIAGNOSIS — M170 Bilateral primary osteoarthritis of knee: Secondary | ICD-10-CM

## 2019-07-13 DIAGNOSIS — Z7189 Other specified counseling: Secondary | ICD-10-CM

## 2019-07-13 DIAGNOSIS — Z139 Encounter for screening, unspecified: Secondary | ICD-10-CM

## 2019-07-13 DIAGNOSIS — I8393 Asymptomatic varicose veins of bilateral lower extremities: Secondary | ICD-10-CM

## 2019-07-13 DIAGNOSIS — F419 Anxiety disorder, unspecified: Secondary | ICD-10-CM

## 2019-07-13 DIAGNOSIS — M19042 Primary osteoarthritis, left hand: Secondary | ICD-10-CM

## 2019-07-13 DIAGNOSIS — R4586 Emotional lability: Secondary | ICD-10-CM

## 2019-07-13 DIAGNOSIS — R454 Irritability and anger: Secondary | ICD-10-CM

## 2019-07-13 NOTE — Progress Notes (Signed)
Subjective:   HPI  Michelle Mcmahon is a 64 y.o. female who presents for Chief Complaint  Patient presents with  . Annual Exam    with fasting labs     Patient Care Team: Machi Whittaker, Cleda Mccreedy as PCP - General (Family Medicine) Sees dentist Sees eye doctor Gyn: Dr. Noland Fordyce Dr. Pollyann Savoy, Rheumatology Dr. Jeani Hawking, GI  Concerns: Here for physical  Overall doing ok.  At her recent virtual consult we added Trintellix. We discussed her concerns for depression.  we had cut back on Wellbutrin and risperidone.   She feels this is helping.   Interestingly, Cant finish cup cofee now with trintillex  She wants to try something for toenail fungus she has on 1 specific toenail  walking daily for exercise.  Doing fine on Saxenda for weight management.  Primary biliary cholangitis-she recently saw her liver doctor and things are stable per her report.  I do not have these records.  She notes that they advise follow-up in 2022   Reviewed their medical, surgical, family, social, medication, and allergy history and updated chart as appropriate.  Past Medical History:  Diagnosis Date  . 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 Anxiety 05/06/2016  . Arrhythmia 05/06/2016   During Child Birth Only  . Atrophic vaginitis    04/2013  . Cervical polyp 04/2013   Dr. Ernestina Penna, biopsied   . H/O mammogram 01/2013   normal  . Hemorrhoids 05/06/2016  . Hyperlipidemia 05/06/2016  . Menopausal state    started Citalopram 2014   . Primary biliary cirrhosis (HCC) 2014   Dr. Elnoria Howard  . Routine gynecological examination    Dr. Ernestina Penna  . Shoulder bursitis    left, prior steroidal shots at Urgent Care  . 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 CERVICAL BIOPSY  04/2013   Dr. Ernestina Penna, gynecology  . COLONOSCOPY     10/2016, prior in 2014, tubular adenoma, serrated adenoma, Dr. Elnoria Howard; repeat q3 years  . LIVER  BIOPSY  05/2013    Social History   Socioeconomic History  . Marital status: Married    Spouse name: Not on file  . Number of children: Not on file  . Years of education: Not on file  . Highest education level: Not on file  Occupational History  . Not on file  Tobacco Use  . Smoking status: Former Smoker    Packs/day: 1.00    Years: 20.00    Pack years: 20.00    Types: Cigarettes    Quit date: 07/08/1983    Years since quitting: 36.0  . Smokeless tobacco: Never Used  Substance and Sexual Activity  . Alcohol use: Yes    Alcohol/week: 14.0 standard drinks    Types: 14 Glasses of wine per week  . Drug use: No  . Sexual activity: Not on file  Other Topics Concern  . Not on file  Social History Narrative   Married, 2 children, age 21yo and 96yo, 6 grandchildren, and she watches one of the 63 yo,  exercise - walk often, has [employer], homemaker.    05/2017   Social Determinants of Health   Financial Resource Strain:   . Difficulty of Paying Living Expenses: Not on file  Food Insecurity:   . Worried About Programme researcher, broadcasting/film/video in the Last Year: Not on file  . Ran Out of Food in the Last Year:  Not on file  Transportation Needs:   . Lack of Transportation (Medical): Not on file  . Lack of Transportation (Non-Medical): Not on file  Physical Activity:   . Days of Exercise per Week: Not on file  . Minutes of Exercise per Session: Not on file  Stress:   . Feeling of Stress : Not on file  Social Connections:   . Frequency of Communication with Friends and Family: Not on file  . Frequency of Social Gatherings with Friends and Family: Not on file  . Attends Religious Services: Not on file  . Active Member of Clubs or Organizations: Not on file  . Attends Archivist Meetings: Not on file  . Marital Status: Not on file  Intimate Partner Violence:   . Fear of Current or Ex-Partner: Not on file  . Emotionally Abused: Not on file  . Physically Abused: Not on file  .  Sexually Abused: Not on file    Family History  Problem Relation Age of Onset  . Other Mother        accidental death  . Cancer Father        died of colon cancer age 30yo  . Heart disease Father        palpitations  . Colon cancer Father 18  . Cancer Brother        bone  . Diabetes Maternal Grandmother   . Heart disease Maternal Grandmother   . Stroke Neg Hx   . Hypertension Neg Hx      Current Outpatient Medications:  .  ALPRAZolam (XANAX) 0.5 MG tablet, Take 1 tablet (0.5 mg total) by mouth at bedtime as needed., Disp: 30 tablet, Rfl: 1 .  buPROPion (WELLBUTRIN XL) 150 MG 24 hr tablet, Take 1 tablet (150 mg total) by mouth daily., Disp: 30 tablet, Rfl: 0 .  cholecalciferol (VITAMIN D3) 25 MCG (1000 UT) tablet, Take 1 tablet (1,000 Units total) by mouth daily., Disp: 90 tablet, Rfl: 3 .  Insulin Pen Needle (BD PEN NEEDLE NANO U/F) 32G X 4 MM MISC, 1 each by Does not apply route at bedtime., Disp: 100 each, Rfl: 11 .  Liraglutide -Weight Management (SAXENDA) 18 MG/3ML SOPN, Inject 3 mg into the skin daily., Disp: 3 mL, Rfl: 6 .  Multiple Vitamin (MULTIVITAMIN WITH MINERALS) TABS tablet, Take 1 tablet by mouth at bedtime., Disp: , Rfl:  .  risperiDONE (RISPERDAL) 0.5 MG tablet, TAKE ONE TABLET BY MOUTH TWICE A DAY, Disp: 60 tablet, Rfl: 2 .  Thiamine HCl (VITAMIN B-1 PO), Take by mouth daily., Disp: , Rfl:  .  ursodiol (ACTIGALL) 300 MG capsule, Take 600 mg by mouth 2 (two) times daily. , Disp: , Rfl:  .  vortioxetine HBr (TRINTELLIX) 10 MG TABS tablet, Take 1 tablet (10 mg total) by mouth daily., Disp: 30 tablet, Rfl: 1 .  Efinaconazole 10 % SOLN, Apply 1 application topically daily., Disp: 8 mL, Rfl: 1 .  NEEDLE, DISP, 30 G (BD ECLIPSE NEEDLE) 30G X 1/2" MISC, 1 each by Does not apply route once a week. (Patient not taking: Reported on 07/13/2019), Disp: 5 each, Rfl: 2 .  Semaglutide,0.25 or 0.5MG /DOS, (OZEMPIC, 0.25 OR 0.5 MG/DOSE,) 2 MG/1.5ML SOPN, Inject 0.25 mg into the skin  once a week. (Patient not taking: Reported on 09/17/2018), Disp: 1.5 mL, Rfl: 1  Allergies  Allergen Reactions  . Citalopram     intolerance  . Effexor [Venlafaxine]     Mental fog  Review of Systems Constitutional: -fever, -chills, -sweats, -unexpected weight change, -decreased appetite, -fatigue Allergy: -sneezing, -itching, -congestion Dermatology: -changing moles, --rash, -lumps ENT: -runny nose, -ear pain, -sore throat, -hoarseness, -sinus pain, -teeth pain, - ringing in ears, -hearing loss, -nosebleeds Cardiology: -chest pain, -palpitations, -swelling, -difficulty breathing when lying flat, -waking up short of breath Respiratory: -cough, -shortness of breath, -difficulty breathing with exercise or exertion, -wheezing, -coughing up blood Gastroenterology: -abdominal pain, -nausea, -vomiting, -diarrhea, -constipation, -blood in stool, -changes in bowel movement, -difficulty swallowing or eating Hematology: -bleeding, -bruising  Musculoskeletal: -joint aches, -muscle aches, -joint swelling, -back pain, -neck pain, -cramping, -changes in gait Ophthalmology: denies vision changes, eye redness, itching, discharge Urology: -burning with urination, -difficulty urinating, -blood in urine, -urinary frequency, -urgency, -incontinence Neurology: -headache, -weakness, -tingling, -numbness, -memory loss, -falls, -dizziness Psychology: +depressed mood, -agitation, -sleep problems Breast/gyn: -breast tendnerss, -discharge, -lumps, -vaginal discharge,- irregular periods, -heavy periods     Objective:  BP 112/88   Pulse 68   Temp 98 F (36.7 C)   Ht 5\' 9"  (1.753 m)   Wt 194 lb 9.6 oz (88.3 kg)   SpO2 98%   BMI 28.74 kg/m   General appearance: alert, no distress, WD/WN, white female Skin: right great toenail thickened and green brown discoloration, rest of toenails unremarkable HEENT: normocephalic, conjunctiva/corneas normal, sclerae anicteric, PERRLA, EOMi, nares patent, no discharge  or erythema, pharynx normal Oral cavity: MMM, tongue normal, teeth normal Neck: supple, no lymphadenopathy, no thyromegaly, no masses, normal ROM, no bruits Chest: non tender, normal shape and expansion Heart: RRR, normal S1, S2, no murmurs Lungs: CTA bilaterally, no wheezes, rhonchi, or rales Abdomen: +bs, soft, non tender, non distended, no masses, no hepatomegaly, no splenomegaly, no bruits Back: non tender, normal ROM, no scoliosis Musculoskeletal: upper extremities non tender, no obvious deformity, normal ROM throughout, lower extremities non tender, no obvious deformity, normal ROM throughout Extremities: no edema, no cyanosis, no clubbing Pulses: 2+ symmetric, upper and lower extremities, normal cap refill Neurological: alert, oriented x 3, CN2-12 intact, strength normal upper extremities and lower extremities, sensation normal throughout, DTRs 2+ throughout, no cerebellar signs, gait normal Breast/gyn/rectal - deferred to gynecology    Assessment and Plan :   Encounter Diagnoses  Name Primary?  . Encounter for health maintenance examination in adult Yes  . Depression, major, single episode, moderate (HCC)   . BMI 28.0-28.9,adult   . Post-menopausal   . Mood swings   . Primary biliary cholangitis (HCC)   . Varicose veins of both lower extremities, unspecified whether complicated   . Primary osteoarthritis of both hands   . Primary osteoarthritis of both feet   . Primary osteoarthritis of both knees   . Hyperlipidemia, unspecified hyperlipidemia type   . Anxiety   . Vitamin D deficiency   . Vaccine counseling   . Hot flashes   . Onychomycosis   . Screening for condition   . Menopause   . Hot flash, menopausal   . Mood swing   . Irritability   . Insomnia     Physical exam - discussed and counseled on healthy lifestyle, diet, exercise, preventative care, vaccinations, sick and well care, proper use of emergency dept and after hours care, and addressed their concerns.     Health screening: Advised they see their eye doctor yearly for routine vision care. Advised they see their dentist yearly for routine dental care including hygiene visits twice yearly. See your gynecologist yearly for routine gynecological care.  Bone density - recommended bone  density evaluation due to risk factors   Cancer screening Counseled on self breast exams, mammograms, cervical cancer screening  Colonoscopy:  Reviewed colonoscopy on file that is up to date  Reviewed mammogram and pap smear on file   Vaccinations: Advised yearly influenza vaccine Advised Shingrix, Covid, and pneumococcal vaccines.  She is up to date on Tdap.  She will consider these other vaccines   Separate significant chronic issues discussed: onychomycosis - isolated, begin Jublia, discussed risks/benefits, and timeframe of use of medication  Reviewed GI notes regarding primary biliary cholangitis   Depression - recently transitioned to Trintellix.  She will touch base with me in 2 weeks.  We will try to transition off risperidone.  C/t Wellbutrin for now  C/t rest of medicaiton as usual  C/t f/u with rheumatology    Mercadies was seen today for annual exam.  Diagnoses and all orders for this visit:  Encounter for health maintenance examination in adult -     Comprehensive metabolic panel -     CBC with Differential -     TSH regular -     Lipid Panel -     Vitamin D, 25-hydroxy -     Hepatitis B surface antibody  Depression, major, single episode, moderate (HCC) -     TSH regular  BMI 28.0-28.9,adult  Post-menopausal  Mood swings  Primary biliary cholangitis (HCC) -     Comprehensive metabolic panel -     CBC with Differential  Varicose veins of both lower extremities, unspecified whether complicated  Primary osteoarthritis of both hands  Primary osteoarthritis of both feet  Primary osteoarthritis of both knees  Hyperlipidemia, unspecified hyperlipidemia type -      Lipid Panel  Anxiety  Vitamin D deficiency -     Vitamin D, 25-hydroxy  Vaccine counseling  Hot flashes  Onychomycosis  Screening for condition -     Hepatitis B surface antibody  Menopause -     ALPRAZolam (XANAX) 0.5 MG tablet; Take 1 tablet (0.5 mg total) by mouth at bedtime as needed.  Hot flash, menopausal -     ALPRAZolam (XANAX) 0.5 MG tablet; Take 1 tablet (0.5 mg total) by mouth at bedtime as needed.  Mood swing -     ALPRAZolam (XANAX) 0.5 MG tablet; Take 1 tablet (0.5 mg total) by mouth at bedtime as needed.  Irritability -     ALPRAZolam (XANAX) 0.5 MG tablet; Take 1 tablet (0.5 mg total) by mouth at bedtime as needed.  Insomnia -     ALPRAZolam (XANAX) 0.5 MG tablet; Take 1 tablet (0.5 mg total) by mouth at bedtime as needed.  Other orders -     Efinaconazole 10 % SOLN; Apply 1 application topically daily.    Follow-up pending labs, yearly for physical

## 2019-07-14 LAB — COMPREHENSIVE METABOLIC PANEL
ALT: 16 IU/L (ref 0–32)
AST: 23 IU/L (ref 0–40)
Albumin/Globulin Ratio: 1.4 (ref 1.2–2.2)
Albumin: 4.4 g/dL (ref 3.8–4.8)
Alkaline Phosphatase: 102 IU/L (ref 39–117)
BUN/Creatinine Ratio: 18 (ref 12–28)
BUN: 14 mg/dL (ref 8–27)
Bilirubin Total: 0.6 mg/dL (ref 0.0–1.2)
CO2: 23 mmol/L (ref 20–29)
Calcium: 9.3 mg/dL (ref 8.7–10.3)
Chloride: 102 mmol/L (ref 96–106)
Creatinine, Ser: 0.8 mg/dL (ref 0.57–1.00)
GFR calc Af Amer: 91 mL/min/{1.73_m2} (ref >59)
GFR calc non Af Amer: 79 mL/min/{1.73_m2} (ref >59)
Globulin, Total: 3.2 g/dL (ref 1.5–4.5)
Glucose: 88 mg/dL (ref 65–99)
Potassium: 3.8 mmol/L (ref 3.5–5.2)
Sodium: 138 mmol/L (ref 134–144)
Total Protein: 7.6 g/dL (ref 6.0–8.5)

## 2019-07-14 LAB — CBC WITH DIFFERENTIAL/PLATELET
Basophils Absolute: 0 10*3/uL (ref 0.0–0.2)
Basos: 0 %
EOS (ABSOLUTE): 0.1 10*3/uL (ref 0.0–0.4)
Eos: 1 %
Hematocrit: 39.9 % (ref 34.0–46.6)
Hemoglobin: 14 g/dL (ref 11.1–15.9)
Immature Grans (Abs): 0 10*3/uL (ref 0.0–0.1)
Immature Granulocytes: 0 %
Lymphocytes Absolute: 3.4 10*3/uL — ABNORMAL HIGH (ref 0.7–3.1)
Lymphs: 52 %
MCH: 33.2 pg — ABNORMAL HIGH (ref 26.6–33.0)
MCHC: 35.1 g/dL (ref 31.5–35.7)
MCV: 95 fL (ref 79–97)
Monocytes Absolute: 0.5 10*3/uL (ref 0.1–0.9)
Monocytes: 7 %
Neutrophils Absolute: 2.6 10*3/uL (ref 1.4–7.0)
Neutrophils: 40 %
Platelets: 185 10*3/uL (ref 150–450)
RBC: 4.22 x10E6/uL (ref 3.77–5.28)
RDW: 11.9 % (ref 11.7–15.4)
WBC: 6.6 10*3/uL (ref 3.4–10.8)

## 2019-07-14 LAB — VITAMIN D 25 HYDROXY (VIT D DEFICIENCY, FRACTURES): Vit D, 25-Hydroxy: 58.4 ng/mL (ref 30.0–100.0)

## 2019-07-14 LAB — LIPID PANEL
Chol/HDL Ratio: 3.1 ratio (ref 0.0–4.4)
Cholesterol, Total: 184 mg/dL (ref 100–199)
HDL: 59 mg/dL (ref >39)
LDL Chol Calc (NIH): 104 mg/dL — ABNORMAL HIGH (ref 0–99)
Triglycerides: 121 mg/dL (ref 0–149)
VLDL Cholesterol Cal: 21 mg/dL (ref 5–40)

## 2019-07-14 LAB — HEPATITIS B SURFACE ANTIBODY, QUANTITATIVE: Hepatitis B Surf Ab Quant: 286.8 m[IU]/mL (ref >9.9)

## 2019-07-14 LAB — TSH: TSH: 1.05 u[IU]/mL (ref 0.450–4.500)

## 2019-07-15 MED ORDER — EFINACONAZOLE 10 % EX SOLN: 1.0000 "application " | Freq: Every day | CUTANEOUS | 1 refills | Status: DC

## 2019-07-15 MED ORDER — ALPRAZOLAM 0.5 MG PO TABS: 0.5000 mg | ORAL_TABLET | Freq: Every evening | ORAL | 1 refills | Status: DC | PRN

## 2019-07-19 ENCOUNTER — Telehealth: Payer: Self-pay | Admitting: Medical

## 2019-07-19 NOTE — Telephone Encounter (Signed)
Apply a layer of liquid, 1-2 drops, enough to coat the "one" toenail affected daily for 3 or more months until resolved

## 2019-07-19 NOTE — Telephone Encounter (Signed)
Pharmacy called [facility] [address] to get clarification on the pts. efinaconazole 10 soln they need to know how many drops and how many toes it will be put on daily.

## 2019-07-19 NOTE — Telephone Encounter (Signed)
I cannot tell if I sent this to you.  She should have a bone density scan if not done already.  This is for osteoporosis screening.  I put in the order  Please call to schedule your bone density test.   [facility]  [phone] [address], [address] [place], [place] [address]

## 2019-07-20 NOTE — Telephone Encounter (Signed)
Pharmacy aware of directions 

## 2019-07-22 ENCOUNTER — Encounter: Payer: Self-pay | Admitting: Medical

## 2019-07-27 ENCOUNTER — Encounter: Payer: Self-pay | Admitting: Medical

## 2019-07-28 ENCOUNTER — Other Ambulatory Visit: Payer: Self-pay | Admitting: Medical

## 2019-07-28 ENCOUNTER — Telehealth: Payer: Self-pay

## 2019-07-28 MED ORDER — CICLOPIROX 0.77 % EX GEL: 1.0000 "application " | Freq: Every day | CUTANEOUS | 1 refills | Status: DC

## 2019-07-28 NOTE — Telephone Encounter (Signed)
I sent a different nail treatment called Ciclopirox to pharmacy to use topically daily.  Apply 8 hours before washing nail.  Clean nail with alcohol every 7 days.  This may take > 1 month to clear up.  Have her call insurance to find out what we have to do to get Saxenda covered.   This is her safest option for weight loss medication.

## 2019-07-28 NOTE — Telephone Encounter (Signed)
I submitted a PA for the pts. Bernie Covey and it was denied by the insurance, I then submitted an appeal for the medication and it was also denied by the insurance company.

## 2019-07-29 NOTE — Telephone Encounter (Signed)
I called Michelle Mcmahon with pt. Stating that she needs to contact her insurance company to see if there is anything they can do to get this medication covered.

## 2019-08-11 ENCOUNTER — Telehealth: Payer: Self-pay

## 2019-08-11 NOTE — Telephone Encounter (Signed)
I submitted a PA for the pts. Trintellix 10 mg and it was approved from 08/10/19-08/09/20.

## 2019-08-23 ENCOUNTER — Telehealth: Payer: Self-pay | Admitting: Medical

## 2019-08-23 NOTE — Telephone Encounter (Signed)
Received requested records from [facility] 

## 2019-09-13 ENCOUNTER — Other Ambulatory Visit: Payer: Self-pay | Admitting: Medical

## 2019-09-13 DIAGNOSIS — K743 Primary biliary cirrhosis: Secondary | ICD-10-CM

## 2019-09-13 DIAGNOSIS — Z79899 Other long term (current) drug therapy: Secondary | ICD-10-CM

## 2019-09-13 MED ORDER — TERBINAFINE HCL 250 MG PO TABS: 250.0000 mg | ORAL_TABLET | Freq: Every day | ORAL | 0 refills | Status: AC

## 2019-09-20 ENCOUNTER — Encounter: Payer: Self-pay | Admitting: Medical

## 2019-09-23 ENCOUNTER — Other Ambulatory Visit: Payer: Self-pay | Admitting: Medical

## 2019-11-02 ENCOUNTER — Other Ambulatory Visit: Payer: Self-pay | Admitting: Medical

## 2020-01-03 ENCOUNTER — Telehealth: Payer: Self-pay | Admitting: Medical

## 2020-01-03 NOTE — Telephone Encounter (Signed)
P.A [facility] renewal received.  Called pt and she states she hasn't been on in a while, there was an issue with cost.  I advised should have been covered til 7/21.  She will check and see how much it was before and decide if she wants to pursue,  I advised would need follow up appt with [facility].  She will call back if decides she wants to go back on medication.

## 2020-02-01 ENCOUNTER — Other Ambulatory Visit: Payer: Self-pay | Admitting: Medical

## 2020-02-01 NOTE — Progress Notes (Deleted)
Office Visit Note  Patient: Michelle Mcmahon             Date of Birth: 06/20/1956           MRN: [id number]             PCP: Jac Canavan, PA-C Referring: Jac Canavan, PA-C Visit Date: 02/15/2020 Occupation: @GUAROCC @  Subjective:  No chief complaint on file.   History of Present Illness: Michelle Mcmahon is a 64 y.o. female ***   Activities of Daily Living:  Patient reports morning stiffness for *** {minute/hour:19697}.   Patient {ACTIONS;DENIES/REPORTS:21021675::"Denies"} nocturnal pain.  Difficulty dressing/grooming: {ACTIONS;DENIES/REPORTS:21021675::"Denies"} Difficulty climbing stairs: {ACTIONS;DENIES/REPORTS:21021675::"Denies"} Difficulty getting out of chair: {ACTIONS;DENIES/REPORTS:21021675::"Denies"} Difficulty using hands for taps, buttons, cutlery, and/or writing: {ACTIONS;DENIES/REPORTS:21021675::"Denies"}  No Rheumatology ROS completed.   PMFS History:  Patient Active Problem List   Diagnosis Date Noted  . Onychomycosis 07/13/2019  . Depression, major, single episode, moderate (HCC) 06/21/2019  . BMI 28.0-28.9,adult 01/06/2019  . Post-menopausal 08/25/2018  . Mood swings 05/25/2018  . Vaccine counseling 05/20/2017  . Need for influenza vaccination 05/20/2017  . Screening for condition 05/20/2017  . Primary osteoarthritis of both knees 04/15/2017  . 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 Hot flashes 07/29/2016  . Anxiety 05/06/2016  . Varicose vein of leg 05/06/2016  . Hemorrhoids 05/06/2016  . Hyperlipidemia 05/06/2016  . Myofascial pain 05/06/2016  . Osteoarthritis of both hands 05/06/2016  . Osteoarthritis of both feet 05/06/2016  . Vitamin D deficiency 05/06/2016  . Primary biliary cholangitis (HCC) 09/22/2013  . Atrophic vaginitis 09/22/2013    Past Medical History:  Diagnosis Date  . 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 Anxiety 05/06/2016  . Arrhythmia 05/06/2016   During Child Birth Only  . Atrophic  vaginitis    04/2013  . Cervical polyp 04/2013   Dr. 05/2013, biopsied   . H/O mammogram 01/2013   normal  . Hemorrhoids 05/06/2016  . Hyperlipidemia 05/06/2016  . Menopausal state    started Citalopram 2014   . Primary biliary cirrhosis (HCC) 2014   Dr. 2015  . Routine gynecological examination    Dr. Elnoria Howard  . Shoulder bursitis    left, prior steroidal shots at Urgent Care  . Varicose vein of leg 05/06/2016    Family History  Problem Relation Age of Onset  . Other Mother        accidental death  . Cancer Father        died of colon cancer age 27yo  . Heart disease Father        palpitations  . Colon cancer Father 1  . Cancer Brother        bone  . Diabetes Maternal Grandmother   . Heart disease Maternal Grandmother   . Stroke Neg Hx   . Hypertension Neg Hx    Past Surgical History:  Procedure Laterality Date  . CERVICAL BIOPSY  04/2013   Dr. 05/2013, gynecology  . COLONOSCOPY     10/2016, prior in 2014, tubular adenoma, serrated adenoma, Dr. 2015; repeat q3 years  . LIVER BIOPSY  05/2013   Social History   Social History Narrative   Married, 2 children, age 58yo and 68yo, 6 grandchildren, and she watches one of the 63 yo,  exercise - walk often, has 2, homemaker.    05/2017   Immunization History  Administered Date(s) Administered  . Hepatitis B, adult 04/25/2013, 01/01/2015  . Influenza,inj,Quad PF,6+ Mos 05/20/2017, 05/25/2018  .  Influenza-Unspecified 04/27/2013, 04/30/2016  . Tdap 01/30/2015     Objective: Vital Signs: There were no vitals taken for this visit.   Physical Exam   Musculoskeletal Exam: ***  CDAI Exam: CDAI Score: -- Patient Global: --; Provider Global: -- Swollen: --; Tender: -- Joint Exam 02/15/2020   No joint exam has been documented for this visit   There is currently no information documented on the homunculus. Go to the Rheumatology activity and complete the homunculus joint exam.  Investigation: No  additional findings.  Imaging: No results found.  Recent Labs: Lab Results  Component Value Date   WBC 6.6 07/13/2019   HGB 14.0 07/13/2019   PLT 185 07/13/2019   NA 138 07/13/2019   K 3.8 07/13/2019   CL 102 07/13/2019   CO2 23 07/13/2019   GLUCOSE 88 07/13/2019   BUN 14 07/13/2019   CREATININE 0.80 07/13/2019   BILITOT 0.6 07/13/2019   ALKPHOS 102 07/13/2019   AST 23 07/13/2019   ALT 16 07/13/2019   PROT 7.6 07/13/2019   ALBUMIN 4.4 07/13/2019   CALCIUM 9.3 07/13/2019   GFRAA 91 07/13/2019    Speciality Comments: No specialty comments available.  Procedures:  No procedures performed Allergies: Citalopram and Effexor [venlafaxine]   Assessment / Plan:     Visit Diagnoses: No diagnosis found.  Orders: No orders of the defined types were placed in this encounter.  No orders of the defined types were placed in this encounter.   Face-to-face time spent with patient was *** minutes. Greater than 50% of time was spent in counseling and coordination of care.  Follow-Up Instructions: No follow-ups on file.   Ellen Henri, CMA  Note - This record has been created using Animal nutritionist.  Chart creation errors have been sought, but may not always  have been located. Such creation errors do not reflect on  the standard of medical care.

## 2020-02-15 ENCOUNTER — Ambulatory Visit: Payer: Self-pay | Admitting: Rheumatology

## 2020-04-30 ENCOUNTER — Other Ambulatory Visit: Payer: Self-pay | Admitting: Medical

## 2020-06-26 ENCOUNTER — Other Ambulatory Visit: Payer: Self-pay | Admitting: Medical

## 2020-06-26 ENCOUNTER — Telehealth: Payer: Self-pay | Admitting: Medical

## 2020-06-26 MED ORDER — BUPROPION HCL ER (XL) 300 MG PO TB24
300.0000 mg | ORAL_TABLET | ORAL | 0 refills | Status: DC
Start: 2020-06-26 — End: 2020-08-03

## 2020-06-26 NOTE — Telephone Encounter (Signed)
I saw her email message.  I sent the higher dose 300 mg once daily to her pharmacy.  Which is similar to what she is doing.  Let us go ahead and schedule her for a med check now

## 2020-06-26 NOTE — Telephone Encounter (Signed)
Appointment has been scheduled.

## 2020-07-17 LAB — HM MAMMOGRAPHY

## 2020-07-19 ENCOUNTER — Encounter: Payer: Self-pay | Admitting: Medical

## 2020-07-27 ENCOUNTER — Encounter: Payer: Self-pay | Admitting: Medical

## 2020-08-03 ENCOUNTER — Other Ambulatory Visit: Payer: Self-pay

## 2020-08-03 ENCOUNTER — Ambulatory Visit (INDEPENDENT_AMBULATORY_CARE_PROVIDER_SITE_OTHER): Payer: 59 | Admitting: Medical

## 2020-08-03 ENCOUNTER — Encounter: Payer: Self-pay | Admitting: Medical

## 2020-08-03 VITALS — BP 144/82 | HR 63 | Ht 69.0 in | Wt 191.4 lb

## 2020-08-03 DIAGNOSIS — E785 Hyperlipidemia, unspecified: Secondary | ICD-10-CM | POA: Diagnosis not present

## 2020-08-03 DIAGNOSIS — Z7185 Encounter for immunization safety counseling: Secondary | ICD-10-CM

## 2020-08-03 DIAGNOSIS — F419 Anxiety disorder, unspecified: Secondary | ICD-10-CM | POA: Diagnosis not present

## 2020-08-03 DIAGNOSIS — Z6828 Body mass index (BMI) 28.0-28.9, adult: Secondary | ICD-10-CM | POA: Diagnosis not present

## 2020-08-03 DIAGNOSIS — Z780 Asymptomatic menopausal state: Secondary | ICD-10-CM

## 2020-08-03 DIAGNOSIS — E559 Vitamin D deficiency, unspecified: Secondary | ICD-10-CM

## 2020-08-03 DIAGNOSIS — M19042 Primary osteoarthritis, left hand: Secondary | ICD-10-CM

## 2020-08-03 DIAGNOSIS — E2839 Other primary ovarian failure: Secondary | ICD-10-CM

## 2020-08-03 DIAGNOSIS — M170 Bilateral primary osteoarthritis of knee: Secondary | ICD-10-CM

## 2020-08-03 DIAGNOSIS — Z139 Encounter for screening, unspecified: Secondary | ICD-10-CM

## 2020-08-03 DIAGNOSIS — M19041 Primary osteoarthritis, right hand: Secondary | ICD-10-CM

## 2020-08-03 DIAGNOSIS — K743 Primary biliary cirrhosis: Secondary | ICD-10-CM

## 2020-08-03 DIAGNOSIS — N952 Postmenopausal atrophic vaginitis: Secondary | ICD-10-CM

## 2020-08-03 DIAGNOSIS — Z131 Encounter for screening for diabetes mellitus: Secondary | ICD-10-CM

## 2020-08-03 DIAGNOSIS — I8393 Asymptomatic varicose veins of bilateral lower extremities: Secondary | ICD-10-CM

## 2020-08-03 DIAGNOSIS — Z0000 Encounter for general adult medical examination without abnormal findings: Secondary | ICD-10-CM

## 2020-08-03 MED ORDER — BUPROPION HCL ER (XL) 300 MG PO TB24: 300.0000 mg | ORAL_TABLET | ORAL | 3 refills | Status: DC

## 2020-08-03 MED ORDER — SERTRALINE HCL 50 MG PO TABS: 50.0000 mg | ORAL_TABLET | Freq: Every day | ORAL | 2 refills | Status: DC

## 2020-08-03 NOTE — Progress Notes (Signed)
Subjective:   HPI  Michelle Mcmahon is a 65 y.o. female who presents for Chief Complaint  Patient presents with  . Annual Exam    Physical with fasting labs     Patient Care Team: Kenyana Husak, Cleda Mccreedy as PCP - General (Family Medicine) Sees dentist Sees eye doctor Gyn: Dr. Noland Fordyce Dr. Pollyann Savoy, Rheumatology Dr. Jeani Hawking, GI`  Concerns: Main concerns is ongoing anxiety, depression, weight.  Husband has recently been diagnosed pancreatic cancer, found out in July, going to [employer] every 10 days.  Stage 3 inoperable.  On Wellbutrin. Not currently seeing counseling  Reviewed their medical, surgical, family, social, medication, and allergy history and updated chart as appropriate.    Past Medical History:  Diagnosis Date  . 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 Anxiety 05/06/2016  . Arrhythmia 05/06/2016   During Child Birth Only  . Atrophic vaginitis    04/2013  . Cervical polyp 04/2013   Dr. Ernestina Penna, biopsied   . Hemorrhoids 05/06/2016  . Hyperlipidemia 05/06/2016  . Primary biliary cirrhosis (HCC) 2014   Dr. Elnoria Howard  . Routine gynecological examination    Dr. Ernestina Penna  . Shoulder bursitis    left, prior steroidal shots at Urgent Care  . 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 CERVICAL BIOPSY  04/2013   Dr. Ernestina Penna, gynecology  . COLONOSCOPY     10/2016, prior in 2014, tubular adenoma, serrated adenoma, Dr. Elnoria Howard;  . LIVER BIOPSY  05/2013    Social History   Socioeconomic History  . Marital status: Married    Spouse name: Not on file  . Number of children: Not on file  . Years of education: Not on file  . Highest education level: Not on file  Occupational History  . Not on file  Tobacco Use  . Smoking status: Former Smoker    Packs/day: 1.00    Years: 20.00    Pack years: 20.00    Types: Cigarettes    Quit date: 07/08/1983    Years since quitting: 37.0  .  Smokeless tobacco: Never Used  Vaping Use  . Vaping Use: Never used  Substance and Sexual Activity  . Alcohol use: Yes    Alcohol/week: 14.0 standard drinks    Types: 14 Glasses of wine per week  . Drug use: No  . Sexual activity: Not on file  Other Topics Concern  . Not on file  Social History Narrative   Married, 2 children,  6 grandchildren, and she watches one of the grandchildren   Exercise - walk often homemaker.    07/2020   Social Determinants of Health   Financial Resource Strain: Not on file  Food Insecurity: Not on file  Transportation Needs: Not on file  Physical Activity: Not on file  Stress: Not on file  Social Connections: Not on file  Intimate Partner Violence: Not on file    Family History  Problem Relation Age of Onset  . Other Mother        accidental death  . Cancer Father        died of colon cancer age 74yo  . Heart disease Father        palpitations  . Colon cancer Father 66  . Cancer Brother        bone  . Diabetes Maternal Grandmother   . Heart disease Maternal Grandmother   . Stroke Neg  Hx   . Hypertension Neg Hx      Current Outpatient Medications:  .  buPROPion (WELLBUTRIN XL) 300 MG 24 hr tablet, Take 1 tablet (300 mg total) by mouth every morning., Disp: 30 tablet, Rfl: 0 .  Multiple Vitamin (MULTIVITAMIN WITH MINERALS) TABS tablet, Take 1 tablet by mouth at bedtime., Disp: , Rfl:   No Known Allergies    Review of Systems Constitutional: -fever, -chills, -sweats, -unexpected weight change, -decreased appetite, -fatigue Allergy: -sneezing, -itching, -congestion Dermatology: -changing moles, --rash, -lumps ENT: -runny nose, -ear pain, -sore throat, -hoarseness, -sinus pain, -teeth pain, - ringing in ears, -hearing loss, -nosebleeds Cardiology: -chest pain, -palpitations, -swelling, -difficulty breathing when lying flat, -waking up short of breath Respiratory: -cough, -shortness of breath, -difficulty breathing with exercise or  exertion, -wheezing, -coughing up blood Gastroenterology: -abdominal pain, -nausea, -vomiting, -diarrhea, -constipation, -blood in stool, -changes in bowel movement, -difficulty swallowing or eating Hematology: -bleeding, -bruising  Musculoskeletal: -joint aches, -muscle aches, -joint swelling, -back pain, -neck pain, -cramping, -changes in gait Ophthalmology: denies vision changes, eye redness, itching, discharge Urology: -burning with urination, -difficulty urinating, -blood in urine, -urinary frequency, -urgency, -incontinence Neurology: -headache, -weakness, -tingling, -numbness, -memory loss, -falls, -dizziness Psychology: -depressed mood, -agitation, -sleep problems Breast/gyn: -breast tendnerss, -discharge, -lumps, -vaginal discharge,- irregular periods, -heavy periods     Objective:  BP (!) 144/82   Pulse 63   Ht 5\' 9"  (1.753 m)   Wt 191 lb 6.4 oz (86.8 kg)   SpO2 98%   BMI 28.26 kg/m   Wt Readings from Last 3 Encounters:  08/03/20 191 lb 6.4 oz (86.8 kg)  07/13/19 194 lb 9.6 oz (88.3 kg)  06/21/19 187 lb (84.8 kg)   BP Readings from Last 3 Encounters:  08/03/20 (!) 144/82  07/13/19 112/88  09/17/18 108/70     General appearance: alert, no distress, WD/WN, white female Skin: scattered macules, no worrisome lesions HEENT: normocephalic, conjunctiva/corneas normal, sclerae anicteric, PERRLA, EOMi, nares patent, no discharge or erythema, pharynx normal Oral cavity: MMM, tongue normal, teeth normal Neck: supple, no lymphadenopathy, no thyromegaly, no masses, normal ROM, no bruits Chest: non tender, normal shape and expansion Heart: RRR, normal S1, S2, no murmurs Lungs: CTA bilaterally, no wheezes, rhonchi, or rales Abdomen: +bs, soft, non tender, non distended, no masses, no hepatomegaly, no splenomegaly, no bruits Back: non tender, normal ROM, no scoliosis Musculoskeletal: upper extremities non tender, no obvious deformity, normal ROM throughout, lower extremities non  tender, no obvious deformity, normal ROM throughout Extremities: no edema, no cyanosis, no clubbing Pulses: 2+ symmetric, upper and lower extremities, normal cap refill Neurological: alert, oriented x 3, CN2-12 intact, strength normal upper extremities and lower extremities, sensation normal throughout, DTRs 2+ throughout, no cerebellar signs, gait normal Breast/gyn/rectal - deferred to gynecology    Assessment and Plan :   Encounter Diagnoses  Name Primary?  . Encounter for health maintenance examination in adult Yes  . BMI 28.0-28.9,adult   . Anxiety   . Hyperlipidemia, unspecified hyperlipidemia type   . Varicose veins of both lower extremities, unspecified whether complicated   . Primary biliary cholangitis (HCC)   . Primary osteoarthritis of both knees   . Primary osteoarthritis of both hands   . Atrophic vaginitis   . Vitamin D deficiency   . Vaccine counseling   . Screening for condition   . Post-menopausal   . Estrogen deficiency   . Screening for diabetes mellitus     Today you had a preventative care visit or  wellness visit.    Topics today may have included healthy lifestyle, diet, exercise, preventative care, vaccinations, sick and well care, proper use of emergency dept and after hours care, as well as other concerns.     Recommendations: Continue to return yearly for your annual wellness and preventative care visits.  This gives [place] a chance to discuss healthy lifestyle, exercise, vaccinations, review your chart record, and perform screenings where appropriate.  I recommend you see your eye doctor yearly for routine vision care.  I recommend you see your dentist yearly for routine dental care including hygiene visits twice yearly.  See your gynecologist yearly for routine gynecological care.     Vaccination recommendations were reviewed  You are up to date on tetanus vaccine You are up to date on covid vaccine You are up to date on flu vaccine  Shingles  vaccine:  I recommend you have a shingles vaccine to help prevent shingles or herpes zoster outbreak.   Please call your insurer to inquire about coverage for the Shingrix vaccine given in 2 doses.   Some insurers cover this vaccine after age 70, some cover this after age 74.  If your insurer covers this, then call to schedule appointment to have this vaccine here.   Screening for cancer: Breast cancer screening: You should perform a self breast exam monthly.   We reviewed recommendations for regular mammograms and breast cancer screening.  Colon cancer screening:  I reviewed your colonoscopy on file that is up to date from 2018  Cervical cancer screening: We reviewed recommendations for pap smear screening.  06/2019 pap reviewed  Skin cancer screening: Check your skin regularly for new changes, growing lesions, or other lesions of concern Come in for evaluation if you have skin lesions of concern.  Lung cancer screening: If you have a greater than 30 pack year history of tobacco use, then you qualify for lung cancer screening with a chest CT scan  We currently don't have screenings for other cancers besides breast, cervical, colon, and lung cancers.  If you have a strong family history of cancer or have other cancer screening concerns, please let me know.    Bone health: Get at least 150 minutes of aerobic exercise weekly Get weight bearing exercise at least once weekly Advised updated bone density test   Please call to schedule your bone density test/screen for osteoporosis.   [facility]  [phone] [address] [place], [place] [address]    Heart health: Get at least 150 minutes of aerobic exercise weekly Limit alcohol It is important to maintain a healthy blood pressure and healthy cholesterol numbers   Separate significant issues discussed: Anxiety and depression-continue Wellbutrin, add Zoloft.  Strongly recommend  counseling.  Expressed my sympathy for the situation and recent diagnosis of pancreatic cancer in her husband.  Primary biliary cirrhosis-continue routine follow-up with Dr. Elnoria Howard  History of vitamin D deficiency-continue supplement  History of elevated lipids-recheck labs today  Varicose veins-mild, no worrisome findings today    Angelica was seen today for annual exam.  Diagnoses and all orders for this visit:  Encounter for health maintenance examination in adult -     Basic metabolic panel -     Hepatic function panel -     Lipid panel -     CBC with Differential/Platelet -     Hemoglobin A1c -     AFP tumor marker  BMI 28.0-28.9,adult  Anxiety  Hyperlipidemia, unspecified  hyperlipidemia type -     Lipid panel  Varicose veins of both lower extremities, unspecified whether complicated  Primary biliary cholangitis (HCC) -     Hepatic function panel -     AFP tumor marker  Primary osteoarthritis of both knees  Primary osteoarthritis of both hands  Atrophic vaginitis  Vitamin D deficiency  Vaccine counseling  Screening for condition -     AFP tumor marker  Post-menopausal -     DG Bone Density; Future  Estrogen deficiency -     DG Bone Density; Future  Screening for diabetes mellitus -     Basic metabolic panel -     Hemoglobin A1c    Follow-up pending labs, yearly for physical

## 2020-08-03 NOTE — Patient Instructions (Signed)
Today you had a preventative care visit or wellness visit.    Topics today may have included healthy lifestyle, diet, exercise, preventative care, vaccinations, sick and well care, proper use of emergency dept and after hours care, as well as other concerns.     Recommendations: Continue to return yearly for your annual wellness and preventative care visits.  This gives [place] a chance to discuss healthy lifestyle, exercise, vaccinations, review your chart record, and perform screenings where appropriate.  I recommend you see your eye doctor yearly for routine vision care.  I recommend you see your dentist yearly for routine dental care including hygiene visits twice yearly.  See your gynecologist yearly for routine gynecological care.     Vaccination recommendations were reviewed  You are up to date on tetanus vaccine You are up to date on covid vaccine You are up to date on flu vaccine  Shingles vaccine:  I recommend you have a shingles vaccine to help prevent shingles or herpes zoster outbreak.   Please call your insurer to inquire about coverage for the Shingrix vaccine given in 2 doses.   Some insurers cover this vaccine after age 52, some cover this after age 71.  If your insurer covers this, then call to schedule appointment to have this vaccine here.   Screening for cancer: Breast cancer screening: You should perform a self breast exam monthly.   We reviewed recommendations for regular mammograms and breast cancer screening.  Colon cancer screening:  I reviewed your colonoscopy on file that is up to date from 2018  Cervical cancer screening: We reviewed recommendations for pap smear screening.  06/2019 pap reviewed  Skin cancer screening: Check your skin regularly for new changes, growing lesions, or other lesions of concern Come in for evaluation if you have skin lesions of concern.  Lung cancer screening: If you have a greater than 30 pack year history of tobacco use,  then you qualify for lung cancer screening with a chest CT scan  We currently don't have screenings for other cancers besides breast, cervical, colon, and lung cancers.  If you have a strong family history of cancer or have other cancer screening concerns, please let me know.    Bone health: Get at least 150 minutes of aerobic exercise weekly Get weight bearing exercise at least once weekly Advised updated bone density test   Please call to schedule your bone density test/screen for osteoporosis.   [facility]  [phone] [address] [place], [place] [address]    Heart health: Get at least 150 minutes of aerobic exercise weekly Limit alcohol It is important to maintain a healthy blood pressure and healthy cholesterol numbers

## 2020-08-04 ENCOUNTER — Other Ambulatory Visit: Payer: Self-pay | Admitting: Medical

## 2020-08-04 LAB — CBC WITH DIFFERENTIAL/PLATELET
Basophils Absolute: 0 10*3/uL (ref 0.0–0.2)
Basos: 1 %
EOS (ABSOLUTE): 0.1 10*3/uL (ref 0.0–0.4)
Eos: 1 %
Hematocrit: 42 % (ref 34.0–46.6)
Hemoglobin: 13.8 g/dL (ref 11.1–15.9)
Immature Grans (Abs): 0 10*3/uL (ref 0.0–0.1)
Immature Granulocytes: 0 %
Lymphocytes Absolute: 3.1 10*3/uL (ref 0.7–3.1)
Lymphs: 57 %
MCH: 31.4 pg (ref 26.6–33.0)
MCHC: 32.9 g/dL (ref 31.5–35.7)
MCV: 96 fL (ref 79–97)
Monocytes Absolute: 0.5 10*3/uL (ref 0.1–0.9)
Monocytes: 9 %
Neutrophils Absolute: 1.8 10*3/uL (ref 1.4–7.0)
Neutrophils: 32 %
Platelets: 181 10*3/uL (ref 150–450)
RBC: 4.39 x10E6/uL (ref 3.77–5.28)
RDW: 11.9 % (ref 11.7–15.4)
WBC: 5.5 10*3/uL (ref 3.4–10.8)

## 2020-08-04 LAB — HEPATIC FUNCTION PANEL
ALT: 18 IU/L (ref 0–32)
AST: 25 IU/L (ref 0–40)
Albumin: 4.4 g/dL (ref 3.8–4.8)
Alkaline Phosphatase: 114 IU/L (ref 44–121)
Bilirubin Total: 0.6 mg/dL (ref 0.0–1.2)
Bilirubin, Direct: 0.16 mg/dL (ref 0.00–0.40)
Total Protein: 8 g/dL (ref 6.0–8.5)

## 2020-08-04 LAB — BASIC METABOLIC PANEL
BUN/Creatinine Ratio: 18 (ref 12–28)
BUN: 14 mg/dL (ref 8–27)
CO2: 23 mmol/L (ref 20–29)
Calcium: 9.3 mg/dL (ref 8.7–10.3)
Chloride: 104 mmol/L (ref 96–106)
Creatinine, Ser: 0.79 mg/dL (ref 0.57–1.00)
GFR calc Af Amer: 91 mL/min/{1.73_m2} (ref >59)
GFR calc non Af Amer: 79 mL/min/{1.73_m2} (ref >59)
Glucose: 103 mg/dL — ABNORMAL HIGH (ref 65–99)
Potassium: 5.2 mmol/L (ref 3.5–5.2)
Sodium: 139 mmol/L (ref 134–144)

## 2020-08-04 LAB — HEMOGLOBIN A1C
Est. average glucose Bld gHb Est-mCnc: 103 mg/dL
Hgb A1c MFr Bld: 5.2 % (ref 4.8–5.6)

## 2020-08-04 LAB — LIPID PANEL
Chol/HDL Ratio: 3.6 ratio (ref 0.0–4.4)
Cholesterol, Total: 222 mg/dL — ABNORMAL HIGH (ref 100–199)
HDL: 61 mg/dL (ref >39)
LDL Chol Calc (NIH): 140 mg/dL — ABNORMAL HIGH (ref 0–99)
Triglycerides: 118 mg/dL (ref 0–149)
VLDL Cholesterol Cal: 21 mg/dL (ref 5–40)

## 2020-08-04 LAB — AFP TUMOR MARKER: AFP, Serum, Tumor Marker: 4 ng/mL (ref 0.0–8.3)

## 2020-08-04 MED ORDER — ROSUVASTATIN CALCIUM 5 MG PO TABS: 5.0000 mg | ORAL_TABLET | Freq: Every day | ORAL | 0 refills | Status: DC

## 2020-08-07 ENCOUNTER — Other Ambulatory Visit: Payer: Self-pay | Admitting: Medical

## 2020-08-07 DIAGNOSIS — E2839 Other primary ovarian failure: Secondary | ICD-10-CM

## 2020-08-07 DIAGNOSIS — Z780 Asymptomatic menopausal state: Secondary | ICD-10-CM

## 2020-09-13 ENCOUNTER — Telehealth: Payer: Self-pay | Admitting: Medical

## 2020-09-13 NOTE — Telephone Encounter (Signed)
Awaiting patient response

## 2020-09-13 NOTE — Telephone Encounter (Signed)
°  Please call her.  She can go ahead and double up and take 2 of the 50mg  Zoloft until she runs out.    Find out how many days of tablets that she have left so I can know whether to send a refill at the higher dose  Schedule a follow-up virtually or in person within the next 2 weeks

## 2020-09-14 NOTE — Telephone Encounter (Signed)
Patient has been advised that an appointment is needed.

## 2020-09-26 ENCOUNTER — Telehealth: Payer: Self-pay | Admitting: Medical

## 2020-09-26 NOTE — Telephone Encounter (Signed)
See her email  Have her try cutting back to half dose and see if tremor improves   Lets go ahead and refer to psychiatry as I think we need their help on medication management

## 2020-09-27 ENCOUNTER — Other Ambulatory Visit: Payer: Self-pay | Admitting: Medical

## 2020-09-27 MED ORDER — SERTRALINE HCL 50 MG PO TABS: 50.0000 mg | ORAL_TABLET | Freq: Every day | ORAL | 1 refills | Status: DC

## 2020-09-27 NOTE — Telephone Encounter (Signed)
Should the referral to psychiatry be [facility] to [facility]?

## 2020-09-27 NOTE — Telephone Encounter (Signed)
yes

## 2020-09-27 NOTE — Telephone Encounter (Signed)
Referral has been sent to [facility].

## 2020-11-06 ENCOUNTER — Other Ambulatory Visit: Payer: Self-pay | Admitting: Medical

## 2020-12-25 ENCOUNTER — Other Ambulatory Visit: Payer: Self-pay | Admitting: Medical

## 2020-12-25 ENCOUNTER — Other Ambulatory Visit: Payer: Self-pay

## 2020-12-25 ENCOUNTER — Ambulatory Visit
Admission: RE | Admit: 2020-12-25 | Discharge: 2020-12-25 | Disposition: A | Discharge status: Home / Self Care | Payer: 59 | Source: Ambulatory Visit | Attending: Medical | Admitting: Medical

## 2020-12-25 DIAGNOSIS — Z780 Asymptomatic menopausal state: Secondary | ICD-10-CM

## 2020-12-25 DIAGNOSIS — E2839 Other primary ovarian failure: Secondary | ICD-10-CM

## 2020-12-25 NOTE — Telephone Encounter (Signed)
Received request for refill on sertraline last apt was 08/03/20

## 2021-02-04 ENCOUNTER — Other Ambulatory Visit: Payer: Self-pay | Admitting: Medical

## 2021-03-27 ENCOUNTER — Other Ambulatory Visit: Payer: Self-pay | Admitting: Medical

## 2021-05-04 ENCOUNTER — Other Ambulatory Visit: Payer: Self-pay | Admitting: Medical

## 2021-05-13 ENCOUNTER — Other Ambulatory Visit: Payer: Self-pay

## 2021-05-13 ENCOUNTER — Ambulatory Visit (INDEPENDENT_AMBULATORY_CARE_PROVIDER_SITE_OTHER): Payer: 59 | Admitting: Medical

## 2021-05-13 VITALS — BP 122/80 | HR 66 | Wt 190.6 lb

## 2021-05-13 DIAGNOSIS — Z23 Encounter for immunization: Secondary | ICD-10-CM | POA: Diagnosis not present

## 2021-05-13 DIAGNOSIS — E785 Hyperlipidemia, unspecified: Secondary | ICD-10-CM

## 2021-05-13 DIAGNOSIS — F419 Anxiety disorder, unspecified: Secondary | ICD-10-CM

## 2021-05-13 DIAGNOSIS — R0789 Other chest pain: Secondary | ICD-10-CM | POA: Diagnosis not present

## 2021-05-13 DIAGNOSIS — R251 Tremor, unspecified: Secondary | ICD-10-CM

## 2021-05-13 DIAGNOSIS — K743 Primary biliary cirrhosis: Secondary | ICD-10-CM

## 2021-05-13 DIAGNOSIS — F321 Major depressive disorder, single episode, moderate: Secondary | ICD-10-CM

## 2021-05-13 DIAGNOSIS — Z636 Dependent relative needing care at home: Secondary | ICD-10-CM | POA: Insufficient documentation

## 2021-05-13 DIAGNOSIS — E559 Vitamin D deficiency, unspecified: Secondary | ICD-10-CM

## 2021-05-13 NOTE — Progress Notes (Signed)
Subjective:  Michelle Mcmahon is a 65 y.o. female who presents for Chief Complaint  Patient presents with   med check    Med check. Would like flu shot today. PQ-9 abnormal      Patient Care Team: Segundo Makela, Cleda Mccreedy as PCP - General (Family Medicine) Sees dentist Sees eye doctor Gyn: Dr. Noland Fordyce Dr. Pollyann Savoy, Rheumatology Dr. Jeani Hawking, GI  Here for med check today.  She has a history of depression, anxiety, hyperlipidemia, primary biliary cholangitis, vitamin D deficiency, arthritis  Husband has terminal illness.  He has palliative care in the home.  She and her family are doing care counseling once a week.Husband has terminal illness, and she and him are doing counseling through palliative care [facility]).  Palliative care in the home, hospice not involved yet.    She continues on Wellbutrin and Zoloft for mood.   Under a lot of stress with dealing with husbands health.   She notes tremor.  Has had tremor for years but seems to have gotten worse since started Zoloft/sertraline.   Her daughter has tremors as well.  She gets this at rest, but worse with motion.    Hyperlipidemia - started crestor in 07/2020 . Here for recheck on this fasting.    She saw GI recently for routine f/u on cholangitis.  Continues on current medications  She has recently had some chest pressure.  Can be at rest or activity, can linger for hours.  No bloating, no gas, no belching, no associated SOB, dizziness, or other.    No other aggravating or relieving factors.    No other c/o.  Past Medical History:  Diagnosis Date   Abnormal liver ultrasound    2 hemangiomas in right lobe   Anxiety 05/06/2016   Arrhythmia 05/06/2016   During Child Birth Only   Atrophic vaginitis    04/2013   Cervical polyp 04/2013   Dr. Ernestina Penna, biopsied    Hemorrhoids 05/06/2016   Hyperlipidemia 05/06/2016   Primary biliary cirrhosis (HCC) 2014   Dr. Elnoria Howard   Routine gynecological examination     Dr. 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 90 tablet 3   Multiple Vitamin (MULTIVITAMIN WITH MINERALS) TABS tablet Take 1 tablet by mouth at bedtime.     rosuvastatin (CRESTOR) 5 MG tablet TAKE ONE TABLET BY MOUTH DAILY 90 tablet 0   sertraline (ZOLOFT) 50 MG tablet TAKE ONE TABLET BY MOUTH DAILY 90 tablet 0   No current facility-administered medications on file prior to visit.    The following portions of the patient's history were reviewed and updated as appropriate:  allergies, current medications, past family history, past medical history, past social history, past surgical history and problem list.  ROS Otherwise as in subjective above    Objective: BP 122/80   Pulse 66   Wt 190 lb 9.6 oz (86.5 kg)   BMI 28.15 kg/m   General appearance: alert, no distress, well developed, well nourished Neck: supple, no lymphadenopathy, no thyromegaly, no masses Heart: RRR, normal S1, S2, no murmurs Lungs: CTA bilaterally, no wheezes, rhonchi, or rales Abdomen: +bs, soft, non tender, non distended, no masses, no hepatomegaly, no splenomegaly Pulses: 2+ radial pulses, 2+ pedal pulses, normal cap refill Ext: no edema Neuro: obvious tremor at rest, worse with activity and motion, otherwise CN2-12 intact Psych: anxious, almost tearful, down in mood  EKG Indication chest  discomfort, rate 64 bpm, PR 166 ms, QRS 76 ms, QTC 429 ms, Axis XIV degrees, normal sinus rhythm   Assessment: Encounter Diagnoses  Name Primary?   Hyperlipidemia, unspecified hyperlipidemia type Yes   Needs flu shot    Tremor    Primary biliary cholangitis (HCC)    Chest pressure    Vitamin D deficiency    Depression, major, single episode, moderate (HCC)    Anxiety    Caregiver burden      Plan: Depression and anxiety-cut zoloft to 25mg  or 1/2 tablet daily.   Lets see if tremor improves.   Termor likely physiologic.  Continue welbutrin.  Expressed my empathy for her and husbands situation, his terminal illness.  Palliative care in the home.    Tremor - cut down on Zoloft to 1/2 tablet daily .   Labs as below to further evaluate.    Hyperlipidemia - started statin 07/2020. Labs today for recheck.   Consider CT coronary calcium score.   Primary biliary cholangitis-I reviewed her recent gastroenterology notes from August 2019 visit.  She was continued on ursodiol 300 mg 2 capsules twice daily and routine follow-up with GI.  There was also notation that her next colonoscopy would be due in 2025  I reviewed her last rheumatology notes from 2020 with Dr. 2021.  At that time her diagnoses included primary osteoarthritis of both feet, chronic pain of both knees, trochanteric bursitis of both hips, pain in both hands, primary osteoarthritis of both hands.  Counseled on the influenza virus vaccine.  Vaccine information sheet given.  Influenza vaccine given after consent obtained.  Vit D deficiency - c/t supplement  Chest pressure - likely anxiety and caregiver burden.  EKG reviewed.  Labs today.  Anndee was seen today for med check.  Diagnoses and all orders for this visit:  Hyperlipidemia, unspecified hyperlipidemia type -     Comprehensive metabolic panel -     Lipid panel  Needs flu shot -     Flu Vaccine QUAD 34mo+IM (Fluarix, Fluzone & Alfiuria Quad PF)  Tremor -      Comprehensive metabolic panel -     Vitamin B12 -     Folate -     TSH -     CBC  Primary biliary cholangitis (HCC)  Chest pressure -     EKG 12-Lead  Vitamin D deficiency  Depression, major, single episode, moderate (HCC)  Anxiety  Caregiver burden   Follow up: pending labs

## 2021-05-14 ENCOUNTER — Other Ambulatory Visit: Payer: Self-pay | Admitting: Medical

## 2021-05-14 LAB — COMPREHENSIVE METABOLIC PANEL
ALT: 17 IU/L (ref 0–32)
AST: 22 IU/L (ref 0–40)
Albumin/Globulin Ratio: 1.4 (ref 1.2–2.2)
Albumin: 4.4 g/dL (ref 3.8–4.8)
Alkaline Phosphatase: 117 IU/L (ref 44–121)
BUN/Creatinine Ratio: 20 (ref 12–28)
BUN: 18 mg/dL (ref 8–27)
Bilirubin Total: 0.4 mg/dL (ref 0.0–1.2)
CO2: 25 mmol/L (ref 20–29)
Calcium: 9.8 mg/dL (ref 8.7–10.3)
Chloride: 100 mmol/L (ref 96–106)
Creatinine, Ser: 0.88 mg/dL (ref 0.57–1.00)
Globulin, Total: 3.1 g/dL (ref 1.5–4.5)
Glucose: 95 mg/dL (ref 70–99)
Potassium: 4.7 mmol/L (ref 3.5–5.2)
Sodium: 137 mmol/L (ref 134–144)
Total Protein: 7.5 g/dL (ref 6.0–8.5)
eGFR: 73 mL/min/{1.73_m2} (ref >59)

## 2021-05-14 LAB — FOLATE: Folate: >20 ng/mL (ref >3.0)

## 2021-05-14 LAB — CBC
Hematocrit: 40.6 % (ref 34.0–46.6)
Hemoglobin: 13.6 g/dL (ref 11.1–15.9)
MCH: 32.4 pg (ref 26.6–33.0)
MCHC: 33.5 g/dL (ref 31.5–35.7)
MCV: 97 fL (ref 79–97)
Platelets: 180 10*3/uL (ref 150–450)
RBC: 4.2 x10E6/uL (ref 3.77–5.28)
RDW: 11.8 % (ref 11.7–15.4)
WBC: 7.2 10*3/uL (ref 3.4–10.8)

## 2021-05-14 LAB — LIPID PANEL
Chol/HDL Ratio: 3.7 ratio (ref 0.0–4.4)
Cholesterol, Total: 213 mg/dL — ABNORMAL HIGH (ref 100–199)
HDL: 58 mg/dL (ref >39)
LDL Chol Calc (NIH): 120 mg/dL — ABNORMAL HIGH (ref 0–99)
Triglycerides: 199 mg/dL — ABNORMAL HIGH (ref 0–149)
VLDL Cholesterol Cal: 35 mg/dL (ref 5–40)

## 2021-05-14 LAB — TSH: TSH: 1.52 u[IU]/mL (ref 0.450–4.500)

## 2021-05-14 LAB — VITAMIN B12: Vitamin B-12: 577 pg/mL (ref 232–1245)

## 2021-05-14 MED ORDER — SERTRALINE HCL 50 MG PO TABS: 25.0000 mg | ORAL_TABLET | Freq: Every day | ORAL | 0 refills | Status: DC

## 2021-05-14 MED ORDER — ROSUVASTATIN CALCIUM 10 MG PO TABS: 10.0000 mg | ORAL_TABLET | Freq: Every day | ORAL | 3 refills | Status: DC

## 2021-06-27 ENCOUNTER — Other Ambulatory Visit: Payer: Self-pay | Admitting: Medical

## 2021-07-12 ENCOUNTER — Other Ambulatory Visit: Payer: Self-pay | Admitting: Medical

## 2021-07-12 ENCOUNTER — Telehealth: Payer: Self-pay

## 2021-07-12 MED ORDER — BUPROPION HCL ER (XL) 300 MG PO TB24: 300.0000 mg | ORAL_TABLET | ORAL | 0 refills | Status: DC

## 2021-07-12 NOTE — Telephone Encounter (Signed)
Received fax from [facility] [address] for a refill on her Bupropion xl 300mg  last apt was 05/13/21. She is switching to this pharmacy from 13/7/22.

## 2021-07-29 ENCOUNTER — Other Ambulatory Visit: Payer: Self-pay | Admitting: Medical

## 2021-07-29 MED ORDER — DULOXETINE HCL 30 MG PO CPEP: 30.0000 mg | ORAL_CAPSULE | Freq: Every day | ORAL | 1 refills | Status: AC

## 2021-08-13 ENCOUNTER — Telehealth: Payer: Self-pay | Admitting: Medical

## 2021-08-13 ENCOUNTER — Other Ambulatory Visit: Payer: Self-pay

## 2021-08-13 MED ORDER — ROSUVASTATIN CALCIUM 10 MG PO TABS
10.0000 mg | ORAL_TABLET | Freq: Every day | ORAL | 1 refills | Status: DC
Start: 2021-08-13 — End: 2022-04-08

## 2021-08-13 NOTE — Telephone Encounter (Signed)
[facility] sent refill request for rosuvastatin calcium please send to the [id number] - [place], [place] - [address]

## 2021-08-22 ENCOUNTER — Encounter: Payer: Self-pay | Admitting: Internal Medicine

## 2021-10-07 ENCOUNTER — Other Ambulatory Visit: Payer: Self-pay | Admitting: Medical

## 2021-10-07 DIAGNOSIS — M170 Bilateral primary osteoarthritis of knee: Secondary | ICD-10-CM

## 2021-10-07 DIAGNOSIS — M19041 Primary osteoarthritis, right hand: Secondary | ICD-10-CM

## 2021-10-31 ENCOUNTER — Other Ambulatory Visit: Payer: Self-pay | Admitting: Medical

## 2021-10-31 NOTE — Telephone Encounter (Signed)
Requesting refill on her bupropion last apt was 05/13/21. ?

## 2021-11-05 ENCOUNTER — Other Ambulatory Visit: Payer: Self-pay | Admitting: Medical

## 2021-11-06 NOTE — Telephone Encounter (Signed)
Requesting refill on pts. Zoloft from the pharmacy did not see it in her current med list.  ?

## 2021-11-27 LAB — HM MAMMOGRAPHY

## 2021-11-28 ENCOUNTER — Encounter: Payer: Self-pay | Admitting: Medical

## 2021-11-28 ENCOUNTER — Encounter: Payer: Self-pay | Admitting: Internal Medicine

## 2022-01-30 ENCOUNTER — Other Ambulatory Visit: Payer: Self-pay | Admitting: Medical

## 2022-03-12 ENCOUNTER — Encounter: Payer: Self-pay | Admitting: Internal Medicine

## 2022-04-08 ENCOUNTER — Other Ambulatory Visit: Payer: Self-pay | Admitting: Medical

## 2022-04-15 ENCOUNTER — Encounter: Payer: Self-pay | Admitting: Internal Medicine
